# Patient Record
Sex: Female | Born: 1980 | Race: White | Hispanic: No | Marital: Married | Smoking: Never smoker
Health system: Southern US, Community
[De-identification: ages and names within clinical notes are randomized; demographics above are authoritative.]

## PROBLEM LIST (undated history)

## (undated) DIAGNOSIS — F329 Major depressive disorder, single episode, unspecified: Secondary | ICD-10-CM

## (undated) HISTORY — PX: TONSILLECTOMY: SUR1361

## (undated) HISTORY — PX: APPENDECTOMY: SHX54

## (undated) HISTORY — PX: BREAST SURGERY: SHX581

## (undated) HISTORY — PX: ABDOMINAL SURGERY: SHX537

## (undated) HISTORY — PX: TUBAL LIGATION: SHX77

## (undated) HISTORY — DX: Major depressive disorder, single episode, unspecified: F32.9

---

## 2017-09-09 ENCOUNTER — Other Ambulatory Visit: Payer: Self-pay

## 2017-09-09 ENCOUNTER — Emergency Department (INDEPENDENT_AMBULATORY_CARE_PROVIDER_SITE_OTHER)
Admission: EM | Admit: 2017-09-09 | Discharge: 2017-09-09 | Disposition: A | Discharge status: Home / Self Care | Payer: Managed Care, Other (non HMO) | Source: Home / Self Care | Attending: Family Medicine | Admitting: Family Medicine

## 2017-09-09 ENCOUNTER — Emergency Department (INDEPENDENT_AMBULATORY_CARE_PROVIDER_SITE_OTHER): Payer: Managed Care, Other (non HMO)

## 2017-09-09 DIAGNOSIS — M25512 Pain in left shoulder: Secondary | ICD-10-CM

## 2017-09-09 DIAGNOSIS — M79622 Pain in left upper arm: Secondary | ICD-10-CM | POA: Diagnosis not present

## 2017-09-09 MED ORDER — KETOROLAC TROMETHAMINE 60 MG/2ML IM SOLN
60.0000 mg | Freq: Once | INTRAMUSCULAR | Status: AC
  Administered 2017-09-09: 60 mg via INTRAMUSCULAR

## 2017-09-09 MED ORDER — TRAMADOL HCL 50 MG PO TABS: 50.0000 mg | ORAL_TABLET | Freq: Four times a day (QID) | ORAL | 0 refills | Status: DC | PRN

## 2017-09-09 NOTE — ED Provider Notes (Signed)
[facility]    CSN: [id number] Arrival date & time: 09/09/17  1658     History   Chief Complaint Chief Complaint  Patient presents with  . Shoulder Pain    HPI Diana Burton is a 37 y.o. female.   HPI  Diana Burton is a 37 y.o. female presenting to UC with c/o constant Left shoulder pain that started about 3 weeks ago. She believes possibly due to a workout routine but denies specific known injury.  Pain is worse when movement, severe at times, especially trying to raise her arm above her head. Pain is aching and throbbing, radiating down her upper arm.  She tried OTC Tylenol and Motrin w/o relief.     History reviewed. No pertinent past medical history.  There are no active problems to display for this patient.   Past Surgical History:  Procedure Laterality Date  . ABDOMINAL SURGERY     lipo and tummy tuck  . APPENDECTOMY    . BREAST SURGERY     implants and lumpectomy  . CESAREAN SECTION    . TONSILLECTOMY    . TUBAL LIGATION      OB History   None      Home Medications    Prior to Admission medications   Medication Sig Start Date End Date Taking? Authorizing Provider  phentermine 15 MG capsule Take 15 mg by mouth every morning.   Yes [provider]  sertraline (ZOLOFT) 25 MG tablet Take 25 mg by mouth daily.   Yes [provider]  traMADol (ULTRAM) 50 MG tablet Take 1 tablet (50 mg total) by mouth every 6 (six) hours as needed. 09/09/17   Lurene Shadow, PA-C    Family History Family History  Problem Relation Age of Onset  . Stroke Mother   . Cancer Mother   . Cancer Father     Social History Social History   Tobacco Use  . Smoking status: Unknown If Ever Smoked  Substance Use Topics  . Alcohol use: Yes  . Drug use: Not Currently     Allergies   Patient has no known allergies.   Review of Systems Review of Systems  Musculoskeletal: Positive for arthralgias. Negative for joint swelling, neck pain and  neck stiffness.  Skin: Negative for color change, rash and wound.  Neurological: Negative for numbness.     Physical Exam Triage Vital Signs ED Triage Vitals [09/09/17 1714]  Enc Vitals Group     BP (!) 143/77     Pulse Rate 68     Resp      Temp 97.9 F (36.6 C)     Temp Source Oral     SpO2 100 %     Weight 182 lb (82.6 kg)     Height 5\' 11"  (1.803 m)     Head Circumference      Peak Flow      Pain Score 8     Pain Loc      Pain Edu?      Excl. in GC?    No data found.  Updated Vital Signs BP (!) 143/77 (BP Location: Right Arm)   Pulse 68   Temp 97.9 F (36.6 C) (Oral)   Ht 5\' 11"  (1.803 m)   Wt 182 lb (82.6 kg)   LMP 08/26/2017   SpO2 100%   BMI 25.38 kg/m   Visual Acuity Right Eye Distance:   Left Eye Distance:   Bilateral Distance:  Right Eye Near:   Left Eye Near:    Bilateral Near:     Physical Exam  Constitutional: She is oriented to person, place, and time. She appears well-developed and well-nourished.  HENT:  Head: Normocephalic and atraumatic.  Eyes: EOM are normal.  Neck: Normal range of motion.  Cardiovascular: Normal rate.  Pulses:      Radial pulses are 2+ on the left side.  Pulmonary/Chest: Effort normal.  Musculoskeletal: She exhibits tenderness. She exhibits no edema.  Left shoulder: tenderness to anterior aspect and proximal deltoid. Decreased abduction due to pain.  Full ROM elbow, non-tender.  5/5 grip strength.  Neurological: She is alert and oriented to person, place, and time.  Skin: Skin is warm and dry. Capillary refill takes less than 2 seconds.  Psychiatric: She has a normal mood and affect. Her behavior is normal.  Nursing note and vitals reviewed.    UC Treatments / Results  Labs (all labs ordered are listed, but only abnormal results are displayed) Labs Reviewed - No data to display  EKG None  Radiology Dg Shoulder Left  Result Date: 09/09/2017 CLINICAL DATA:  Left shoulder pain EXAM: LEFT SHOULDER - 2+  VIEW COMPARISON:  None. FINDINGS: There is no evidence of fracture or dislocation. There is no evidence of arthropathy or other focal bone abnormality. Soft tissues are unremarkable. IMPRESSION: Negative. Electronically Signed   By: Charlett NoseKevin  Dover M.D.   On: 09/09/2017 18:48    Procedures Procedures (including critical care time)  Medications Ordered in UC Medications  ketorolac (TORADOL) injection 60 mg (60 mg Intramuscular Given 09/09/17 1853)    Initial Impression / Assessment and Plan / UC Course  I have reviewed the triage vital signs and the nursing notes.  Pertinent labs & imaging results that were available during my care of the patient were reviewed by me and considered in my medical decision making (see chart for details).     Pain likely due to biceps tendonitis Will try conservative tx F/u with Sports Medicine  Final Clinical Impressions(s) / UC Diagnoses   Final diagnoses:  Left shoulder pain  Left upper arm pain     Discharge Instructions      Tramadol is strong pain medication. While taking, do not drink alcohol, drive, or perform any other activities that requires focus while taking these medications.   Please call to schedule a follow up appointment with Sports Medicine, Dr. Denyse Amassorey, this week for further evaluation and treatment of shoulder pain.    ED Prescriptions    Medication Sig Dispense Auth. Provider   traMADol (ULTRAM) 50 MG tablet Take 1 tablet (50 mg total) by mouth every 6 (six) hours as needed. 15 tablet Lurene ShadowPhelps, Khaleelah Yowell O, PA-C     Controlled Substance Prescriptions [place] Controlled Substance Registry consulted? Yes, I have consulted the  Controlled Substances Registry for this patient, and feel the risk/benefit ratio today is favorable for proceeding with this prescription for a controlled substance.   Lurene Shadowhelps, Orlena Garmon O, [place]-C 09/10/17 [phone]

## 2017-09-09 NOTE — Discharge Instructions (Signed)
°  Tramadol is strong pain medication. While taking, do not drink alcohol, drive, or perform any other activities that requires focus while taking these medications.   Please call to schedule a follow up appointment with Sports Medicine, Dr. Denyse Amassorey, this week for further evaluation and treatment of shoulder pain.

## 2017-09-09 NOTE — ED Triage Notes (Signed)
Pt states that she has been having shoulder pain for about 3 weeks now.  Denies injury.  Hurts all the time now from shoulder to arm.

## 2017-09-12 ENCOUNTER — Encounter: Payer: Self-pay | Admitting: Family Medicine

## 2017-09-12 ENCOUNTER — Ambulatory Visit (INDEPENDENT_AMBULATORY_CARE_PROVIDER_SITE_OTHER): Payer: Managed Care, Other (non HMO) | Admitting: Family Medicine

## 2017-09-12 VITALS — BP 116/56 | HR 72 | Ht 71.0 in | Wt 181.0 lb

## 2017-09-12 DIAGNOSIS — F32A Depression, unspecified: Secondary | ICD-10-CM

## 2017-09-12 DIAGNOSIS — M25512 Pain in left shoulder: Secondary | ICD-10-CM

## 2017-09-12 DIAGNOSIS — F329 Major depressive disorder, single episode, unspecified: Secondary | ICD-10-CM | POA: Insufficient documentation

## 2017-09-12 HISTORY — DX: Depression, unspecified: F32.A

## 2017-09-12 NOTE — Patient Instructions (Addendum)
Thank you for coming in today. Attend PT Modify lifting. Keep you hand in your peripheral vision with lifting.  If pain is >3/10 (or more than mild) reduce the activity.   Call or go to the ER if you develop a large red swollen joint with extreme pain or oozing puss.   Recheck with me in 4 weeks.  Return sooner if needed.   Up to 2 aleve twice daily for pain as needed.     Shoulder Impingement Syndrome Rehab Ask your health care provider which exercises are safe for you. Do exercises exactly as told by your health care provider and adjust them as directed. It is normal to feel mild stretching, pulling, tightness, or discomfort as you do these exercises, but you should stop right away if you feel sudden pain or your pain gets worse.Do not begin these exercises until told by your health care provider. Stretching and range of motion exercise This exercise warms up your muscles and joints and improves the movement and flexibility of your shoulder. This exercise also helps to relieve pain and stiffness. Exercise A: Passive horizontal adduction  1. Sit or stand and pull your left / right elbow across your chest, toward your other shoulder. Stop when you feel a gentle stretch in the back of your shoulder and upper arm. ? Keep your arm at shoulder height. ? Keep your arm as close to your body as you comfortably can. 2. Hold for __________ seconds. 3. Slowly return to the starting position. Repeat __________ times. Complete this exercise __________ times a day. Strengthening exercises These exercises build strength and endurance in your shoulder. Endurance is the ability to use your muscles for a long time, even after they get tired. Exercise B: External rotation, isometric 1. Stand or sit in a doorway, facing the door frame. 2. Bend your left / right elbow and place the back of your wrist against the door frame. Only your wrist should be touching the frame. Keep your upper arm at your  side. 3. Gently press your wrist against the door frame, as if you are trying to push your arm away from your abdomen. ? Avoid shrugging your shoulder while you press your hand against the door frame. Keep your shoulder blade tucked down toward the middle of your back. 4. Hold for __________ seconds. 5. Slowly release the tension, and relax your muscles completely before you do the exercise again. Repeat __________ times. Complete this exercise __________ times a day. Exercise C: Internal rotation, isometric  1. Stand or sit in a doorway, facing the door frame. 2. Bend your left / right elbow and place the inside of your wrist against the door frame. Only your wrist should be touching the frame. Keep your upper arm at your side. 3. Gently press your wrist against the door frame, as if you are trying to push your arm toward your abdomen. ? Avoid shrugging your shoulder while you press your hand against the door frame. Keep your shoulder blade tucked down toward the middle of your back. 4. Hold for __________ seconds. 5. Slowly release the tension, and relax your muscles completely before you do the exercise again. Repeat __________ times. Complete this exercise __________ times a day. Exercise D: Scapular protraction, supine  1. Lie on your back on a firm surface. Hold a __________ weight in your left / right hand. 2. Raise your left / right arm straight into the air so your hand is directly above your shoulder joint. 3.  Push the weight into the air so your shoulder lifts off of the surface that you are lying on. Do not move your head, neck, or back. 4. Hold for __________ seconds. 5. Slowly return to the starting position. Let your muscles relax completely before you repeat this exercise. Repeat __________ times. Complete this exercise __________ times a day. Exercise E: Scapular retraction  1. Sit in a stable chair without armrests, or stand. 2. Secure an exercise band to a stable object  in front of you so the band is at shoulder height. 3. Hold one end of the exercise band in each hand. Your palms should face down. 4. Squeeze your shoulder blades together and move your elbows slightly behind you. Do not shrug your shoulders while you do this. 5. Hold for __________ seconds. 6. Slowly return to the starting position. Repeat __________ times. Complete this exercise __________ times a day. Exercise F: Shoulder extension  1. Sit in a stable chair without armrests, or stand. 2. Secure an exercise band to a stable object in front of you where the band is above shoulder height. 3. Hold one end of the exercise band in each hand. 4. Straighten your elbows and lift your hands up to shoulder height. 5. Squeeze your shoulder blades together and pull your hands down to the sides of your thighs. Stop when your hands are straight down by your sides. Do not let your hands go behind your body. 6. Hold for __________ seconds. 7. Slowly return to the starting position. Repeat __________ times. Complete this exercise __________ times a day. This information is not intended to replace advice given to you by your health care provider. Make sure you discuss any questions you have with your health care provider. Document Released: 02/05/2005 Document Revised: 10/13/2015 Document Reviewed: 01/08/2015 Elsevier Interactive Patient Education  [employer].

## 2017-09-13 ENCOUNTER — Encounter: Payer: Self-pay | Admitting: Family Medicine

## 2017-09-13 NOTE — Progress Notes (Signed)
Subjective:    I'm seeing this patient as a consultation for:  Diana RocherErin Phelps PA-C  CC: Shoulder pain  HPI: Diana BumpsJessica notes a one-month history of left shoulder pain.  She denies any injury but notes that she is been weight lifting and doing kick boxing class.  She notes pain in the left upper arm worse with overhead motion reaching back.  She denies any radiating pain weakness or numbness.  She notes her symptoms worsened after she pain in her house over the weekend.  She is been trying Tylenol and ibuprofen which helped a little.  She was seen in urgent care where she was thought to have a rotator cuff strain and was prescribed tramadol which helped a bit.  X-ray of the left shoulder was unremarkable.  She denies any fevers or chills  Past medical history, Surgical history, Family history not pertinant except as noted below, Social history, Allergies, and medications have been entered into the medical record, reviewed, and no changes needed.   Review of Systems: No headache, visual changes, nausea, vomiting, diarrhea, constipation, dizziness, abdominal pain, skin rash, fevers, chills, night sweats, weight loss, swollen lymph nodes, body aches, joint swelling, muscle aches, chest pain, shortness of breath, mood changes, visual or auditory hallucinations.   Objective:    Vitals:   09/12/17 1326  BP: (!) 116/56  Pulse: 72   General: Well Developed, well nourished, and in no acute distress.  Neuro/Psych: Alert and oriented x3, extra-ocular muscles intact, able to move all 4 extremities, sensation grossly intact. Skin: Warm and dry, no rashes noted.  Respiratory: Not using accessory muscles, speaking in full sentences, trachea midline.  Cardiovascular: Pulses palpable, no extremity edema. Abdomen: Does not appear distended. MSK:  Left shoulder: Normal-appearing with no deformity or erythema. Range of motion full passive motion but pain with active abduction beyond 100 degrees. Strength is  intact however pain with resisted abduction. Positive Hawkins and Neer's test. Positive empty can test. Negative Yergason's and speeds test.   Right shoulder normal-appearing nontender normal motion normal strength negative impingement testing.  C-spine: Normal-appearing nontender normal motion.  Pulses capillary fill and sensation are intact bilateral upper extremities.  Procedure: Real-time Ultrasound Guided Injection of left subacromial bursa  Device: GE Logiq E   Images permanently stored and available for review in the ultrasound unit. Verbal informed consent obtained.  Discussed risks and benefits of procedure. Warned about infection bleeding damage to structures skin hypopigmentation and fat atrophy among others. Patient expresses understanding and agreement Time-out conducted.   Noted no overlying erythema, induration, or other signs of local infection.   Skin prepped in a sterile fashion.   Local anesthesia: Topical Ethyl chloride.   With sterile technique and under real time ultrasound guidance:  40mg  kenalog and 2ml marcaine injected easily.   Completed without difficulty   Pain immediately resolved suggesting accurate placement of the medication.   Advised to call if fevers/chills, erythema, induration, drainage, or persistent bleeding.   Images permanently stored and available for review in the ultrasound unit.  Impression: Technically successful ultrasound guided injection.      Lab and Radiology Results No results found for this or any previous visit (from the past 72 hour(s)). Dg Shoulder Left  Result Date: 09/09/2017 CLINICAL DATA:  Left shoulder pain EXAM: LEFT SHOULDER - 2+ VIEW COMPARISON:  None. FINDINGS: There is no evidence of fracture or dislocation. There is no evidence of arthropathy or other focal bone abnormality. Soft tissues are unremarkable. IMPRESSION: Negative. Electronically  Signed   By: Charlett Nose M.D.   On: 09/09/2017 18:48  I personally  (independently) visualized and performed the interpretation of the images attached in this note.   Impression and Recommendations:    Assessment and Plan: 37 y.o. female with left shoulder pain due to rotator cuff tendinitis.  Plan for physical therapy home exercise program injection as above and prescription strength NSAIDs as needed.  Recheck in about 6 weeks.  Return sooner if needed..   Orders Placed This Encounter  Procedures  . Ambulatory referral to Physical Therapy    Referral Priority:   Routine    Referral Type:   Physical Medicine    Referral Reason:   Specialty Services Required    Requested Specialty:   Physical Therapy   No orders of the defined types were placed in this encounter.   Discussed warning signs or symptoms. Please see discharge instructions. Patient expresses understanding.

## 2017-09-16 ENCOUNTER — Encounter: Payer: Self-pay | Admitting: Physical Therapy

## 2017-09-16 ENCOUNTER — Ambulatory Visit: Payer: Managed Care, Other (non HMO) | Admitting: Physical Therapy

## 2017-09-16 DIAGNOSIS — M25512 Pain in left shoulder: Secondary | ICD-10-CM | POA: Diagnosis not present

## 2017-09-16 NOTE — Patient Instructions (Addendum)
Access Code: [email or web address]  URL: [email or web address]  Date: 09/16/2017  Prepared by: Ivery QualeBrian Nelson   Exercises  Standing Shoulder Posterior Capsule Stretch - 10 reps - 3 sets - 2x daily - 6x weekly  Doorway Pec Stretch at 90 Degrees Abduction - 10 reps - 3 sets - 2x daily - 6x weekly  Standing Shoulder Row with Anchored Resistance - 10 reps - 3 sets - 2x daily - 6x weekly  Shoulder Extension with Resistance - Neutral - 10 reps - 3 sets - 2x daily - 6x weekly  Shoulder External Rotation with Anchored Resistance - 10 reps - 3 sets - 2x daily - 6x weekly  Shoulder Internal Rotation with Resistance - 10 reps - 3 sets - 2x daily - 6x weekly  Supine Shoulder Flexion Extension AAROM with Dowel - 10 reps - 3 sets - 2x daily - 6x weekly  Supine Shoulder Abduction AAROM with Dowel - 10 reps - 3 sets - 2x daily - 6x weekly  Pt provided illustrated copy and resistance band  TENS stands for Transcutaneous Electrical Nerve Stimulation. In other words, electrical impulses are allowed to pass through the skin in order to excite a nerve.   Purpose and Use of TENS:  TENS is a method used to manage acute and chronic pain without the use of drugs. It has been effective in managing pain associated with surgery, sprains, strains, trauma, rheumatoid arthritis, and neuralgias. It is a non-addictive, low risk, and non-invasive technique used to control pain. It is not, by any means, a curative form of treatment.   How TENS Works:  Most TENS units are a [employer] pocket-sized unit powered by one 9 volt battery. Attached to the outside of the unit are two lead wires where two pins and/or snaps connect on each wire. All units come with a set of four reusable pads or electrodes. These are placed on the skin surrounding the area involved. By inserting the leads into  the pads, the electricity can pass from the unit making the circuit complete.  As the intensity is turned up slowly, the electrical current enters the  body from the electrodes through the skin to the surrounding nerve fibers. This triggers the release of hormones from within the body. These hormones contain pain relievers. By increasing the circulation of these hormones, the person's pain may be lessened. It is also believed that the electrical stimulation itself helps to block the pain messages being sent to the brain, thus also decreasing the body's perception of pain.   Hazards:  TENS units are NOT to be used by patients with PACEMAKERS, DEFIBRILLATORS, DIABETIC PUMPS, PREGNANT WOMEN, and patients with SEIZURE DISORDERS.  TENS units are NOT to be used over the heart, throat, brain, or spinal cord.  One of the major side effects from the TENS unit may be skin irritation. Some people may develop a rash if they are sensitive to the materials used in the electrodes or the connecting wires.   Wear the unit for 15-5230min .   Avoid overuse due the body getting used to the stem making it not as effective over time.  TENS UNIT: This is helpful for muscle pain and spasm.   Search and Purchase a TENS 7000 2nd edition at www.tenspros.com. OR [employer] It should be less than $30.     TENS unit instructions: Do not shower or bathe with the unit on Turn the unit off before removing electrodes or batteries If the electrodes lose stickiness add a drop of  water to the electrodes after they are disconnected from the unit and place on plastic sheet. If you continued to have difficulty, call the TENS unit company to purchase more electrodes. Do not apply lotion on the skin area prior to use. Make sure the skin is clean and dry as this will help prolong the life of the electrodes. After use, always check skin for unusual red areas, rash or other skin difficulties. If there are any skin problems, does not apply electrodes to the same area. Never remove the electrodes from the unit by pulling the wires. Do not use the TENS unit or electrodes other than as  directed. Do not change electrode placement without consultating your therapist or physician. Keep 2 fingers with between each electrode. Wear time ratio is 2:1, on to off times.    For example on for 30 minutes off for 15 minutes and then on for 30 minutes off for 15 minutes

## 2017-09-16 NOTE — Therapy (Signed)
[facility] [place] [address] [place], [place], [address] Phone: [phone]   Fax:  [phone]  Physical Therapy Evaluation  Patient Details  Name: Diana Burton MRN: [id number] Date of Birth: 04/22/1980 Referring Provider: Stann Mainland, MD   Encounter Date: 09/16/2017  PT End of Session - 09/16/17 0941    Visit Number  1    Number of Visits  12    Date for PT Re-Evaluation  10/28/17    PT Start Time  0850    PT Stop Time  0930    PT Time Calculation (min)  40 min    Activity Tolerance  Patient tolerated treatment well       Past Medical History:  Diagnosis Date  . Depression 09/12/2017    Past Surgical History:  Procedure Laterality Date  . ABDOMINAL SURGERY     lipo and tummy tuck  . APPENDECTOMY    . BREAST SURGERY     implants and lumpectomy  . CESAREAN SECTION    . TONSILLECTOMY    . TUBAL LIGATION      There were no vitals filed for this visit.   Subjective Assessment - 09/16/17 0931    Subjective  Pt relays Lt shoulder pain for last 2 weeks that started after she began kickboxing and weight lifting program. She recieved injection which has helped a lot but she still has pain and difficulty reaching up or out.    Pertinent History  depression    Limitations  Reading    Diagnostic tests  x-rays neg    Currently in Pain?  Yes    Pain Score  3     Pain Location  Shoulder    Pain Orientation  Left    Pain Descriptors / Indicators  Stabbing;Tightness    Pain Type  Acute pain    Pain Radiating Towards  none    Pain Onset  1 to 4 weeks ago    Pain Frequency  Intermittent    Aggravating Factors   reaching    Pain Relieving Factors  injection, NSAIDS    Multiple Pain Sites  No         [facility] PT Assessment - 09/16/17 0001      Assessment   Medical Diagnosis  acute Lt shoulder pain/tendonitis    Referring Provider  Stann Mainland, MD    Prior Therapy  none      Precautions   Precautions  None      Balance Screen   Has the patient fallen in the past 6 months  No    Has the patient had a decrease in activity level because of a fear of falling?   No    Is the patient reluctant to leave their home because of a fear of falling?   No      Prior Function   Level of Independence  Independent    [employer], computer based work    Leisure  shopping      Cognition   Overall Cognitive Status  Within Functional Limits for tasks assessed      Observation/Other Assessments   Focus on Therapeutic Outcomes (FOTO)   40% limited      Sensation   Light Touch  Appears Intact      ROM / Strength   AROM / PROM / Strength  AROM;PROM;Strength      AROM   AROM Assessment Site  Shoulder    Right/Left Shoulder  Left  Left Shoulder Flexion  120 Degrees    Left Shoulder ABduction  105 Degrees    Left Shoulder Internal Rotation  -- T8    Left Shoulder External Rotation  -- WFL      PROM   Overall PROM   Within functional limits for tasks performed      Strength   Overall Strength  -- 4+/5 limited by pain      Palpation   Palpation comment  ttp in posterior/superior/lateral shoulder      Special Tests    Special Tests  -- postive impingment tests                Objective measurements completed on examination: See above findings.      [facility] Adult PT Treatment/Exercise - 09/16/17 0001      Modalities   Modalities  Electrical Stimulation;Moist Heat      Moist Heat Therapy   Number Minutes Moist Heat  15 Minutes with TENS    Moist Heat Location  Shoulder      Electrical Stimulation   Electrical Stimulation Location  Lt shlder    Electrical Stimulation Action  TENS    Electrical Stimulation Parameters  tolerance    Electrical Stimulation Goals  Pain             PT Education - 09/16/17 0941    Education Details  HEP, TENS, poc, ice/heat    Person(s) Educated  Patient    Methods  Demonstration;Explanation;Verbal cues;Handout    Comprehension   Verbalized understanding;Need further instruction          PT Long Term Goals - 09/16/17 0949      PT LONG TERM GOAL #1   Title  Pt will be I and compliant with HEP. 6 weeks 10/28/17    Status  New      PT LONG TERM GOAL #2   Title  Pt will improve FOTO score to less than 26% limited. 6 weeks 10/28/17    Status  New      PT LONG TERM GOAL #3   Title  Pt will improve AROM to WNL to allow for reaching. 6 weeks 10/28/17    Status  New      PT LONG TERM GOAL #4   Title  Pt will improve Lt shoulder strength to 5/5 MMT. 6 weeks 10/28/17      PT LONG TERM GOAL #5   Title  Pt will report less than 2/10 overall pain with all ADL's including exercise and lifting. 6 weeks 10/28/17             Plan - 09/16/17 0944    Clinical Impression Statement  Pt presents with acute Lt shoulder pain consistent with RTC tendonitis or impingment. She is doing some better after injection with overall pain but She has increased pain and tenderness in her shoulder that is increased with reaching up and out and limiting her full functional abilities. She will benefit from skilled PT to adress her deficits    Clinical Presentation  Stable    Clinical Decision Making  Low    Clinical Impairments Affecting Rehab Potential  depression with flat affect    PT Frequency  2x / week    PT Duration  6 weeks    PT Treatment/Interventions  ADLs/Self Care Home Management;Iontophoresis 4mg /ml Dexamethasone;Cryotherapy;Electrical Stimulation;Moist Heat;Ultrasound;Therapeutic exercise;Neuromuscular re-education;Patient/family education;Manual techniques;Passive range of motion;Dry needling    PT Next Visit Plan  review HEP, modalites and MT as needed  Patient will benefit from skilled therapeutic intervention in order to improve the following deficits and impairments:  Decreased activity tolerance, Decreased range of motion, Decreased strength, Increased muscle spasms, Pain  Visit Diagnosis: Acute pain of left  shoulder     Problem List Patient Active Problem List   Diagnosis Date Noted  . Depression 09/12/2017    April Manson, PT, DPT 09/16/2017, 9:54 AM  [facility] [address] [place], [place], [address] Phone: [phone]   Fax:  [phone]  Name: Diana Burton MRN: [id number] Date of Birth: 1980-07-02

## 2017-09-16 NOTE — Addendum Note (Signed)
Addended by: April MansonNELSON, Taraji Mungo R on: 09/16/2017 09:58 AM   Modules accepted: Orders

## 2017-09-20 ENCOUNTER — Ambulatory Visit (INDEPENDENT_AMBULATORY_CARE_PROVIDER_SITE_OTHER): Payer: Managed Care, Other (non HMO) | Admitting: Physical Therapy

## 2017-09-20 DIAGNOSIS — M25512 Pain in left shoulder: Secondary | ICD-10-CM

## 2017-09-20 NOTE — Therapy (Addendum)
[facility] [address] [place], [place], [address] Phone: [phone]   Fax:  [phone]  Physical Therapy Treatment/Discharge  Patient Details  Name: Diana Burton MRN: [id number] Date of Birth: 19-Oct-1980 Referring Provider: Lance Muss, MD   Encounter Date: 09/20/2017  PT End of Session - 09/20/17 0927    Visit Number  2    Number of Visits  12    Date for PT Re-Evaluation  10/28/17    PT Start Time  0852    PT Stop Time  0937    PT Time Calculation (min)  45 min    Activity Tolerance  Patient tolerated treatment well       Past Medical History:  Diagnosis Date  . Depression 09/12/2017    Past Surgical History:  Procedure Laterality Date  . ABDOMINAL SURGERY     lipo and tummy tuck  . APPENDECTOMY    . BREAST SURGERY     implants and lumpectomy  . CESAREAN SECTION    . TONSILLECTOMY    . TUBAL LIGATION      There were no vitals filed for this visit.  Subjective Assessment - 09/20/17 0922    Subjective  Pt relays her shoulder is doing much better.    Currently in Pain?  Yes    Pain Score  1     Pain Location  Shoulder    Pain Orientation  Left    Pain Descriptors / Indicators  Other (Comment) pulling         [facility] PT Assessment - 09/20/17 0001      Assessment   Medical Diagnosis  acute Lt shoulder pain/tendonitis    Referring Provider  Lance Muss, MD      AROM   AROM Assessment Site  Shoulder    Right/Left Shoulder  Left    Left Shoulder Flexion  140 Degrees    Left Shoulder ABduction  120 Degrees    Left Shoulder Internal Rotation  -- T6                   [facility] Adult PT Treatment/Exercise - 09/20/17 0001      Exercises   Exercises  Shoulder      Shoulder Exercises: Standing   External Rotation  Left;20 reps    Theraband Level (Shoulder External Rotation)  Level 1 (Yellow)    Internal Rotation  20 reps;Left    Theraband Level (Shoulder Internal Rotation)  Level 1  (Yellow)    Flexion  AAROM;15 reps cane    ABduction  AAROM;15 reps cane    Extension  20 reps    Theraband Level (Shoulder Extension)  Level 2 (Red)    Row  20 reps    Theraband Level (Shoulder Row)  Level 2 (Red)      Shoulder Exercises: Pulleys   Flexion  2 minutes    ABduction  2 minutes      Shoulder Exercises: Stretch   Cross Chest Stretch  30 seconds;2 reps    Other Shoulder Stretches  chest stretch at doorway 30 sec low and mid      Modalities   Modalities  Electrical Stimulation;Moist Heat      Moist Heat Therapy   Number Minutes Moist Heat  15 Minutes    Moist Heat Location  Shoulder      Electrical Stimulation   Electrical Stimulation Location  Lt shlder    Electrical Stimulation Action  TENS    Electrical Stimulation  Parameters  tolerance    Electrical Stimulation Goals  Pain             PT Education - 09/20/17 0927    Education Details  technique for new exercises    Person(s) Educated  Patient    Methods  Explanation;Demonstration;Tactile cues;Verbal cues    Comprehension  Verbalized understanding;Need further instruction          PT Long Term Goals - 09/16/17 0949      PT LONG TERM GOAL #1   Title  Pt will be I and compliant with HEP. 6 weeks 10/28/17    Status  New      PT LONG TERM GOAL #2   Title  Pt will improve FOTO score to less than 26% limited. 6 weeks 10/28/17    Status  New      PT LONG TERM GOAL #3   Title  Pt will improve AROM to WNL to allow for reaching. 6 weeks 10/28/17    Status  New      PT LONG TERM GOAL #4   Title  Pt will improve Lt shoulder strength to 5/5 MMT. 6 weeks 10/28/17      PT LONG TERM GOAL #5   Title  Pt will report less than 2/10 overall pain with all ADL's including exercise and lifting. 6 weeks 10/28/17            Plan - 09/20/17 6438    Clinical Impression Statement  HEP reviewed with patient with cuing and demostration for technique. She has made great progress thus far with pain and ROM, see updated  measurements. Pt will continue to progress as able.    PT Frequency  2x / week    PT Duration  6 weeks    PT Treatment/Interventions  ADLs/Self Care Home Management;Iontophoresis 23m/ml Dexamethasone;Cryotherapy;Electrical Stimulation;Moist Heat;Ultrasound;Therapeutic exercise;Neuromuscular re-education;Patient/family education;Manual techniques;Passive range of motion;Dry needling    PT Next Visit Plan  progress strengthening as able, modalities PRN       Patient will benefit from skilled therapeutic intervention in order to improve the following deficits and impairments:  Decreased activity tolerance, Decreased range of motion, Decreased strength, Increased muscle spasms, Pain  Visit Diagnosis: Acute pain of left shoulder     Problem List Patient Active Problem List   Diagnosis Date Noted  . Depression 09/12/2017    BDebbe Odea PT, DPT 09/20/2017, 9:30 AM  [facility] [place] [address]Phone: [phone]  Fax:  [phone] Name: Diana TamayoMRN: [id number] of Birth: 804-06-1980  PHYSICAL THERAPY DISCHARGE SUMMARY  Visits from Start of Care: 2  Current functional level related to goals / functional outcomes: See above   Remaining deficits: See above  Plan: Patient agrees to discharge.  Patient goals were not met. Patient is being discharged due to not returning since the last visit.  ?????    BElsie Ra PT, DPT 10/17/17 10:12 AM

## 2017-09-24 ENCOUNTER — Encounter: Payer: Managed Care, Other (non HMO) | Admitting: Physical Therapy

## 2017-09-27 ENCOUNTER — Encounter: Payer: Managed Care, Other (non HMO) | Admitting: Physical Therapy

## 2017-10-10 ENCOUNTER — Ambulatory Visit: Payer: Managed Care, Other (non HMO) | Admitting: Family Medicine

## 2017-11-29 ENCOUNTER — Emergency Department (INDEPENDENT_AMBULATORY_CARE_PROVIDER_SITE_OTHER)
Admission: EM | Admit: 2017-11-29 | Discharge: 2017-11-29 | Disposition: A | Discharge status: Home / Self Care | Payer: Managed Care, Other (non HMO) | Source: Home / Self Care | Attending: Family Medicine | Admitting: Family Medicine

## 2017-11-29 ENCOUNTER — Other Ambulatory Visit: Payer: Self-pay

## 2017-11-29 DIAGNOSIS — J029 Acute pharyngitis, unspecified: Secondary | ICD-10-CM | POA: Diagnosis not present

## 2017-11-29 LAB — POCT RAPID STREP A (OFFICE): Rapid Strep A Screen: NEGATIVE

## 2017-11-29 MED ORDER — PENICILLIN V POTASSIUM 500 MG PO TABS: ORAL_TABLET | ORAL | 0 refills | Status: DC

## 2017-11-29 NOTE — Discharge Instructions (Addendum)
Try warm salt water gargles for sore throat.  Try Ibuprofen 200mg, 4 tabs every 8 hours with food.  °

## 2017-11-29 NOTE — ED Triage Notes (Signed)
Sore throat, headache x 3 days 

## 2017-11-29 NOTE — ED Provider Notes (Signed)
[facility]    CSN: [id number] Arrival date & time: 11/29/17  1411     History   Chief Complaint Chief Complaint  Patient presents with  . Sore Throat    HPI Diana Burton is a 37 y.o. female.   Patient complains of sore throat for two days without cough or fevers, chills, and sweats.  She has had mild sinus pressure but no nasal congestion, and pressure in her ears.  The history is provided by the patient.    Past Medical History:  Diagnosis Date  . Depression 09/12/2017    Patient Active Problem List   Diagnosis Date Noted  . Depression 09/12/2017    Past Surgical History:  Procedure Laterality Date  . ABDOMINAL SURGERY     lipo and tummy tuck  . APPENDECTOMY    . BREAST SURGERY     implants and lumpectomy  . CESAREAN SECTION    . TONSILLECTOMY    . TUBAL LIGATION      OB History   None      Home Medications    Prior to Admission medications   Medication Sig Start Date End Date Taking? Authorizing Provider  penicillin v potassium (VEETID) 500 MG tablet Take one tab by mouth twice daily for 10 days 11/29/17   Lattie Haw, MD  sertraline (ZOLOFT) 25 MG tablet Take 25 mg by mouth daily.    [provider]    Family History Family History  Problem Relation Age of Onset  . Stroke Mother   . Cancer Mother   . Cancer Father     Social History Social History   Tobacco Use  . Smoking status: Unknown If Ever Smoked  . Smokeless tobacco: Never Used  Substance Use Topics  . Alcohol use: Yes  . Drug use: Not Currently     Allergies   Patient has no known allergies.   Review of Systems Review of Systems + sore throat No cough No pleuritic pain No wheezing No nasal congestion No post-nasal drainage + sinus pain/pressure No itchy/red eyes ? earache No hemoptysis No SOB No fever/chills No nausea No vomiting No abdominal pain No diarrhea No urinary symptoms No skin rash + fatigue No myalgias +  headache    Physical Exam Triage Vital Signs ED Triage Vitals  Enc Vitals Group     BP 11/29/17 1458 115/80     Pulse Rate 11/29/17 1458 76     Resp --      Temp 11/29/17 1458 98.6 F (37 C)     Temp Source 11/29/17 1458 Oral     SpO2 11/29/17 1458 100 %     Weight 11/29/17 1459 175 lb (79.4 kg)     Height 11/29/17 1459 5\' 11"  (1.803 m)     Head Circumference --      Peak Flow --      Pain Score 11/29/17 1458 8     Pain Loc --      Pain Edu? --      Excl. in GC? --    No data found.  Updated Vital Signs BP 115/80 (BP Location: Right Arm)   Pulse 76   Temp 98.6 F (37 C) (Oral)   Ht 5\' 11"  (1.803 m)   Wt 79.4 kg   SpO2 100%   BMI 24.41 kg/m   Visual Acuity Right Eye Distance:   Left Eye Distance:   Bilateral Distance:    Right Eye Near:   Left Eye  Near:    Bilateral Near:     Physical Exam Nursing notes and Vital Signs reviewed. Appearance:  Patient appears stated age, and in no acute distress Eyes:  Pupils are equal, round, and reactive to light and accomodation.  Extraocular movement is intact.  Conjunctivae are not inflamed  Ears:  Canals normal.  Tympanic membranes normal.  Nose:  Mildly congested turbinates.  No sinus tenderness.    Pharynx:  Mildly erythematous Neck:  Supple.  Tender enlarged tonsillar nodes bilaterally.  Enlarged posterior/lateral nodes are palpated bilaterally, tender to palpation on the left.   Lungs:  Clear to auscultation.  Breath sounds are equal.  Moving air well. Heart:  Regular rate and rhythm without murmurs, rubs, or gallops.  Abdomen:  Nontender without masses or hepatosplenomegaly.  Bowel sounds are present.  No CVA or flank tenderness.  Extremities:  No edema.  Skin:  No rash present.    UC Treatments / Results  Labs (all labs ordered are listed, but only abnormal results are displayed) Labs Reviewed  STREP A DNA PROBE  POCT RAPID STREP A (OFFICE) negative    EKG None  Radiology No results  found.  Procedures Procedures (including critical care time)  Medications Ordered in UC Medications - No data to display  Initial Impression / Assessment and Plan / UC Course  I have reviewed the triage vital signs and the nursing notes.  Pertinent labs & imaging results that were available during my care of the patient were reviewed by me and considered in my medical decision making (see chart for details).    Begin empiric Pen VK.  Throat culture pending. Followup with Family Doctor if not improved in about 10 days.   Final Clinical Impressions(s) / UC Diagnoses   Final diagnoses:  Acute pharyngitis, unspecified etiology  Pharyngitis, unspecified etiology     Discharge Instructions     Try warm salt water gargles for sore throat.  Try Ibuprofen 200mg , 4 tabs every 8 hours with food.     ED Prescriptions    Medication Sig Dispense Auth. Provider   penicillin v potassium (VEETID) 500 MG tablet Take one tab by mouth twice daily for 10 days 20 tablet Lattie Haw, MD         Lattie Haw, MD 11/29/17 1650

## 2017-11-30 ENCOUNTER — Telehealth: Payer: Self-pay

## 2017-11-30 LAB — STREP A DNA PROBE: Group A Strep Probe: NOT DETECTED

## 2017-11-30 NOTE — Telephone Encounter (Signed)
Left vm with normal tcx results. Advised to call if any questions.

## 2018-02-26 ENCOUNTER — Ambulatory Visit (INDEPENDENT_AMBULATORY_CARE_PROVIDER_SITE_OTHER): Payer: Managed Care, Other (non HMO) | Admitting: Family Medicine

## 2018-02-26 ENCOUNTER — Encounter: Payer: Self-pay | Admitting: Family Medicine

## 2018-02-26 VITALS — BP 123/76 | HR 80 | Ht 71.0 in | Wt 188.0 lb

## 2018-02-26 DIAGNOSIS — M25512 Pain in left shoulder: Secondary | ICD-10-CM | POA: Diagnosis not present

## 2018-02-26 NOTE — Patient Instructions (Signed)
Thank you for coming in today. Attend PT and do home exercises.  Recheck with me as needed.  If not improving we can start thinking MRI and evaluation for surgery.  Call or go to the ER if you develop a large red swollen joint with extreme pain or oozing puss.     Secondary Shoulder Impingement Syndrome Rehab Ask your health care provider which exercises are safe for you. Do exercises exactly as told by your health care provider and adjust them as directed. It is normal to feel mild stretching, pulling, tightness, or discomfort as you do these exercises, but you should stop right away if you feel sudden pain or your pain gets worse. Do not begin these exercises until told by your health care provider. Stretching and range of motion exercise This exercise warms up your muscles and joints and improves the movement and flexibility of your neck and shoulder. This exercise also helps to relieve pain and stiffness. Exercise A: Cervical side bend  1. Using good posture, sit on a stable chair, or stand up. 2. Without moving your shoulders, slowly tilt your left / right ear toward your left / right shoulder until you feel a stretch in your neck muscles. You should be looking straight ahead. 3. Hold for __________ seconds. 4. Slowly return to the starting position. 5. Repeat on your left / right side. Repeat __________ times. Complete this exercise __________ times a day. Strengthening exercises These exercises build strength and endurance in your shoulder. Endurance is the ability to use your muscles for a long time, even after they get tired. Exercise B: Scapular protraction, supine  1. Lie on your back on a firm surface. Hold a __________ weight in your left / right hand. 2. Raise your left / right arm straight into the air so your hand is directly above your shoulder joint. 3. Push the weight into the air so your shoulder lifts off of the surface that you are lying on. Do not move your head,  neck, or back. 4. Hold for __________ seconds. 5. Slowly return to the starting position. Let your muscles relax completely before you repeat this exercise. Repeat __________ times. Complete this exercise __________ times a day. Exercise C: Scapular retraction  1. Sit in a stable chair without armrests, or stand. 2. Secure an exercise band to a stable object in front of you so the band is at shoulder height. 3. Hold one end of the exercise band in each hand. Your palms should face down. 4. Squeeze your shoulder blades together and move your elbows slightly behind you. Do not shrug your shoulders while you do this. 5. Hold for __________ seconds. 6. Slowly return to the starting position. Repeat __________ times. Complete this exercise __________ times a day. Exercise D: Shoulder extension with scapular retraction  1. Sit in a stable chair without armrests, or stand. 2. Secure an exercise band to a stable object in front of you where it is above shoulder height. 3. Hold one end of the exercise band in each hand. 4. Straighten your elbows and lift your hands up to shoulder height. 5. Squeeze your shoulder blades together and pull your hands down to the sides of your thighs. Stop when your hands are straight down by your sides. Do not let your hands go behind your body. 6. Hold for __________ seconds. 7. Slowly return to the starting position. Repeat __________ times. Complete this exercise __________ times a day. Exercise E: Shoulder abduction 1. Sit in  a stable chair without armrests, or stand. 2. If directed, hold a __________ weight in your left / right hand. 3. Start with your arms straight down. Turn your left / right hand so your palm faces in, toward your body. 4. Slowly lift your left / right hand out to your side. Do not lift your hand above shoulder height. ? Keep your arms straight. ? Avoid shrugging your shoulder while you do this movement. Keep your shoulder blade tucked down  toward the middle of your back. 5. Hold for __________ seconds. 6. Slowly lower your arm, and return to the starting position. Repeat __________ times. Complete this exercise __________ times a day. This information is not intended to replace advice given to you by your health care provider. Make sure you discuss any questions you have with your health care provider. Document Released: 02/05/2005 Document Revised: 10/13/2015 Document Reviewed: 01/08/2015 Elsevier Interactive Patient Education  2019 [employer].

## 2018-02-26 NOTE — Progress Notes (Signed)
Diana Burton is a 38 y.o. female who presents to [facility] today for left shoulder pain.  Diana Burton was seen in July for left shoulder pain and was thought to have rotator cuff tendinitis/bursitis or impingement.  She received a subacromial bursa injection and had 2 visits with physical therapy which worked quite well.  She notes over the last month or so she is had return of pain.  She notes pain mostly in the left lateral upper arm.  Pain is worse with overhead motion reaching back and with push-ups.  She continues to exercise with some resistance training as well as kickboxing.  She notes her pain is now becoming moderate to more severe and interfere with exercise as well as home activities such as cooking cleaning lifting and pushing.  She denies any radiating pain down her left arm.  She denies any recent injury or fall.  She is tried some return of home exercise program and ibuprofen which is helped only a little for her shoulder pain.    ROS:  As above  Exam:  BP 123/76   Pulse 80   Ht 5\' 11"  (1.803 m)   Wt 188 lb (85.3 kg)   BMI 26.22 kg/m  Wt Readings from Last 5 Encounters:  02/26/18 188 lb (85.3 kg)  11/29/17 175 lb (79.4 kg)  09/12/17 181 lb (82.1 kg)  09/09/17 182 lb (82.6 kg)   General: Well Developed, well nourished, and in no acute distress.  Neuro/Psych: Alert and oriented x3, extra-ocular muscles intact, able to move all 4 extremities, sensation grossly intact. Skin: Warm and dry, no rashes noted.  Respiratory: Not using accessory muscles, speaking in full sentences, trachea midline.  Cardiovascular: Pulses palpable, no extremity edema. Abdomen: Does not appear distended. MSK: C-spine: Nontender to spinal midline.  Normal neck motion. Left shoulder normal-appearing nontender. Normal external rotation. Internal rotation limited to lumbar spine. Abduction limited to about 120 degrees active and passive. Strength is  intact. Positive Hawkins and Neer's test.  Positive empty can test. Negative Yergason's and speeds test.  Right shoulder normal-appearing nontender normal motion normal strength negative impingement testing.  Pulses cap refill and sensation are intact distal bilateral upper extremities.    Lab and Radiology Results  Procedure: Real-time Ultrasound Guided Injection of left subacromial bursa Device: GE Logiq E   Images permanently stored and available for review in the ultrasound unit. Verbal informed consent obtained.  Discussed risks and benefits of procedure. Warned about infection bleeding damage to structures skin hypopigmentation and fat atrophy among others. Patient expresses understanding and agreement Time-out conducted.   Noted no overlying erythema, induration, or other signs of local infection.   Skin prepped in a sterile fashion.   Local anesthesia: Topical Ethyl chloride.   With sterile technique and under real time ultrasound guidance:  40 mg of Kenalog and 2 mL of Marcaine injected easily.   Completed without difficulty   Pain immediately resolved suggesting accurate placement of the medication.   Advised to call if fevers/chills, erythema, induration, drainage, or persistent bleeding.   Images permanently stored and available for review in the ultrasound unit.  Impression: Technically successful ultrasound guided injection.       EXAM: LEFT SHOULDER - 2+ VIEW  COMPARISON:  None.  FINDINGS: There is no evidence of fracture or dislocation. There is no evidence of arthropathy or other focal bone abnormality. Soft tissues are unremarkable.  IMPRESSION: Negative.   Electronically Signed   By: Caryn Bee  Dover M.D.   On: 09/09/2017 18:48 I personally (independently) visualized and performed the interpretation of the images attached in this note.     Assessment and Plan: 38 y.o. female with left shoulder pain.  Very likely rotator cuff  tendinitis/impingement or bursitis again.  Patient had great resolution of pain with limited physical therapy, home exercise program and subacromial bursa injection about a half a year ago.  Her pain gradually returned.  Plan to repeat the treatment at work previously with repeat subacromial bursa injection and physical therapy and home exercise program.  If symptoms again return or do not have a lasting benefit neck step would likely be further imaging including MRI for potential surgical planning.  Recheck in 6 weeks or so or sooner if needed.   PDMP not reviewed this encounter. Orders Placed This Encounter  Procedures  . Ambulatory referral to Physical Therapy    Referral Priority:   Routine    Referral Type:   Physical Medicine    Referral Reason:   Specialty Services Required    Requested Specialty:   Physical Therapy   No orders of the defined types were placed in this encounter.   Historical information moved to improve visibility of documentation.  Past Medical History:  Diagnosis Date  . Depression 09/12/2017   Past Surgical History:  Procedure Laterality Date  . ABDOMINAL SURGERY     lipo and tummy tuck  . APPENDECTOMY    . BREAST SURGERY     implants and lumpectomy  . CESAREAN SECTION    . TONSILLECTOMY    . TUBAL LIGATION     Social History   Tobacco Use  . Smoking status: Unknown If Ever Smoked  . Smokeless tobacco: Never Used  Substance Use Topics  . Alcohol use: Yes   family history includes Cancer in her father and mother; Stroke in her mother.  Medications: Current Outpatient Medications  Medication Sig Dispense Refill  . sertraline (ZOLOFT) 25 MG tablet Take 25 mg by mouth daily.     No current facility-administered medications for this visit.    No Known Allergies    Discussed warning signs or symptoms. Please see discharge instructions. Patient expresses understanding.

## 2019-03-23 DIAGNOSIS — N898 Other specified noninflammatory disorders of vagina: Secondary | ICD-10-CM | POA: Diagnosis not present

## 2019-03-23 DIAGNOSIS — N390 Urinary tract infection, site not specified: Secondary | ICD-10-CM | POA: Diagnosis not present

## 2019-03-23 DIAGNOSIS — R309 Painful micturition, unspecified: Secondary | ICD-10-CM | POA: Diagnosis not present

## 2019-05-14 ENCOUNTER — Ambulatory Visit (INDEPENDENT_AMBULATORY_CARE_PROVIDER_SITE_OTHER): Payer: BLUE CROSS/BLUE SHIELD

## 2019-05-14 ENCOUNTER — Ambulatory Visit (INDEPENDENT_AMBULATORY_CARE_PROVIDER_SITE_OTHER): Payer: BLUE CROSS/BLUE SHIELD | Admitting: Sports Medicine

## 2019-05-14 ENCOUNTER — Other Ambulatory Visit: Payer: Self-pay

## 2019-05-14 DIAGNOSIS — M1712 Unilateral primary osteoarthritis, left knee: Secondary | ICD-10-CM | POA: Diagnosis not present

## 2019-05-14 DIAGNOSIS — M25561 Pain in right knee: Secondary | ICD-10-CM

## 2019-05-14 DIAGNOSIS — G8929 Other chronic pain: Secondary | ICD-10-CM

## 2019-05-14 DIAGNOSIS — M1711 Unilateral primary osteoarthritis, right knee: Secondary | ICD-10-CM | POA: Insufficient documentation

## 2019-05-14 MED ORDER — MELOXICAM 15 MG PO TABS: ORAL_TABLET | ORAL | 3 refills | Status: DC

## 2019-05-14 NOTE — Progress Notes (Signed)
    Procedures performed today:    None.  Independent interpretation of notes and tests performed by another provider:   None.  Brief History, Exam, Impression, and Recommendations:    Chronic pain of right knee Diana Burton has been having right knee pain for some time now, posterior joint line. Mild gelling. Exam is benign. I did an unofficial ultrasound, no Baker's cyst visible. 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 Return to see me in 4 weeks, MRI if no better.    ___________________________________________ Ihor Austin. Benjamin Stain, M.D., ABFM., CAQSM. Primary Care and Sports Medicine [place] [facility]  Adjunct Instructor of Family Medicine  [employer]

## 2019-05-14 NOTE — Assessment & Plan Note (Signed)
Dima has been having right knee pain for some time now, posterior joint line. Mild gelling. Exam is benign. I did an unofficial ultrasound, no Baker's cyst visible. 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 Return to see me in 4 weeks, MRI if no better.

## 2019-06-11 ENCOUNTER — Other Ambulatory Visit: Payer: Self-pay

## 2019-06-11 ENCOUNTER — Encounter: Payer: Self-pay | Admitting: Sports Medicine

## 2019-06-11 ENCOUNTER — Ambulatory Visit (INDEPENDENT_AMBULATORY_CARE_PROVIDER_SITE_OTHER): Payer: BLUE CROSS/BLUE SHIELD | Admitting: Sports Medicine

## 2019-06-11 DIAGNOSIS — M1711 Unilateral primary osteoarthritis, right knee: Secondary | ICD-10-CM

## 2019-06-11 NOTE — Progress Notes (Signed)
    Procedures performed today:    Procedure: Real-time Ultrasound Guided injection of the right knee Device: Samsung HS60  Verbal informed consent obtained.  Time-out conducted.  Noted no overlying erythema, induration, or other signs of local infection.  Skin prepped in a sterile fashion.  Local anesthesia: Topical Ethyl chloride.  With sterile technique and under real time ultrasound guidance: 1 cc Kenalog 40, 2 cc lidocaine, 2 cc bupivacaine injected easily Completed without difficulty  Pain immediately resolved suggesting accurate placement of the medication.  Advised to call if fevers/chills, erythema, induration, drainage, or persistent bleeding.  Images permanently stored and available for review in the ultrasound unit.  Impression: Technically successful ultrasound guided injection.  Independent interpretation of notes and tests performed by another provider:   None.  Brief History, Exam, Impression, and Recommendations:    Primary osteoarthritis of right knee This is a pleasant 39 year old female, she has had chronic knee pain, x-rays did show mild medial joint space narrowing consistent with mild early arthritis. Unfortunately she failed conservative measures in the form of activity modification, meloxicam, rehab exercises. I injected her knee joint today, return to see me in a month, we did discuss viscosupplementation as a follow-up step.    ___________________________________________ Ihor Austin. Benjamin Stain, M.D., ABFM., CAQSM. Primary Care and Sports Medicine [place] [facility]  Adjunct Instructor of Family Medicine  [employer]

## 2019-06-11 NOTE — Assessment & Plan Note (Signed)
This is a pleasant 39 year old female, she has had chronic knee pain, x-rays did show mild medial joint space narrowing consistent with mild early arthritis. Unfortunately she failed conservative measures in the form of activity modification, meloxicam, rehab exercises. I injected her knee joint today, return to see me in a month, we did discuss viscosupplementation as a follow-up step.

## 2019-07-02 ENCOUNTER — Ambulatory Visit: Payer: BLUE CROSS/BLUE SHIELD | Admitting: Sports Medicine

## 2019-08-13 ENCOUNTER — Emergency Department (INDEPENDENT_AMBULATORY_CARE_PROVIDER_SITE_OTHER)
Admission: RE | Admit: 2019-08-13 | Discharge: 2019-08-13 | Disposition: A | Discharge status: Home / Self Care | Payer: BLUE CROSS/BLUE SHIELD | Source: Ambulatory Visit

## 2019-08-13 ENCOUNTER — Other Ambulatory Visit: Payer: Self-pay | Admitting: Emergency Medicine

## 2019-08-13 ENCOUNTER — Other Ambulatory Visit: Payer: Self-pay

## 2019-08-13 VITALS — BP 144/81 | HR 68 | Temp 98.6°F | Ht 71.0 in | Wt 185.0 lb

## 2019-08-13 DIAGNOSIS — M545 Low back pain, unspecified: Secondary | ICD-10-CM

## 2019-08-13 DIAGNOSIS — R8290 Unspecified abnormal findings in urine: Secondary | ICD-10-CM

## 2019-08-13 DIAGNOSIS — R300 Dysuria: Secondary | ICD-10-CM

## 2019-08-13 DIAGNOSIS — R110 Nausea: Secondary | ICD-10-CM | POA: Diagnosis not present

## 2019-08-13 LAB — POCT CBC W AUTO DIFF (K'VILLE URGENT CARE)

## 2019-08-13 LAB — POCT URINALYSIS DIP (MANUAL ENTRY)
Bilirubin, UA: NEGATIVE
Blood, UA: NEGATIVE
Glucose, UA: NEGATIVE mg/dL
Ketones, POC UA: NEGATIVE mg/dL
Leukocytes, UA: NEGATIVE
Nitrite, UA: NEGATIVE
Protein Ur, POC: NEGATIVE mg/dL
Spec Grav, UA: 1.015 (ref 1.010–1.025)
Urobilinogen, UA: 0.2 E.U./dL
pH, UA: 5 (ref 5.0–8.0)

## 2019-08-13 LAB — POCT URINE PREGNANCY: Preg Test, Ur: NEGATIVE

## 2019-08-13 NOTE — Discharge Instructions (Signed)
  You may take 500mg  acetaminophen every 4-6 hours or in combination with ibuprofen 400-600mg  every 6-8 hours as needed for pain, inflammation, and fever.  Be sure to well hydrated with clear liquids and get at least 8 hours of sleep at night, preferably more while sick.  Drink enough water so your urine is pale yellow to clear in color.   Please follow up with family medicine or your gynecologist next week if not improving.

## 2019-08-13 NOTE — ED Provider Notes (Signed)
[facility]    CSN: [id number] Arrival date & time: 08/13/19  1743      History   Chief Complaint Chief Complaint  Patient presents with  . Dysuria    HPI Diana Burton is a 39 y.o. female.   HPI Diana Burton is a 39 y.o. female presenting to UC with c/o 1 week of cloudy malodorous urine, pain with urination, and nausea. She developed bilateral low back pain yesterday.  Denies fever, chills, vomiting or diarrhea. Denies abdominal pain. No hx of kidney stones. Denies vaginal symptoms but does not mind being tested for STIs.    Past Medical History:  Diagnosis Date  . Depression 09/12/2017    Patient Active Problem List   Diagnosis Date Noted  . Primary osteoarthritis of right knee 05/14/2019  . Depression 09/12/2017    Past Surgical History:  Procedure Laterality Date  . ABDOMINAL SURGERY     lipo and tummy tuck  . APPENDECTOMY    . BREAST SURGERY     implants and lumpectomy  . CESAREAN SECTION    . TONSILLECTOMY    . TUBAL LIGATION      OB History   No obstetric history on file.      Home Medications    Prior to Admission medications   Medication Sig Start Date End Date Taking? Authorizing Provider  phentermine 15 MG capsule Take 15 mg by mouth every morning.   Yes [provider]  meloxicam (MOBIC) 15 MG tablet One tab PO qAM with a meal for 2 weeks, then daily prn pain. 05/14/19   Silverio Decamp, MD  sertraline (ZOLOFT) 25 MG tablet Take 25 mg by mouth daily.    [provider]    Family History Family History  Problem Relation Age of Onset  . Stroke Mother   . Cancer Mother   . Cancer Father     Social History Social History   Tobacco Use  . Smoking status: Never Smoker  . Smokeless tobacco: Never Used  Vaping Use  . Vaping Use: Never used  Substance Use Topics  . Alcohol use: Yes  . Drug use: Not Currently     Allergies   Patient has no known allergies.   Review of Systems Review of  Systems  Constitutional: Negative for chills and fever.  Gastrointestinal: Positive for nausea. Negative for abdominal pain, diarrhea and vomiting.  Genitourinary: Positive for dysuria and frequency. Negative for decreased urine volume, flank pain, hematuria, pelvic pain, urgency, vaginal bleeding, vaginal discharge and vaginal pain.  Musculoskeletal: Positive for back pain. Negative for myalgias.     Physical Exam Triage Vital Signs ED Triage Vitals  Enc Vitals Group     BP 08/13/19 1754 (!) 144/81     Pulse Rate 08/13/19 1754 68     Resp --      Temp 08/13/19 1754 98.6 F (37 C)     Temp Source 08/13/19 1754 Oral     SpO2 08/13/19 1754 100 %     Weight 08/13/19 1755 185 lb (83.9 kg)     Height 08/13/19 1755 5\' 11"  (1.803 m)     Head Circumference --      Peak Flow --      Pain Score 08/13/19 1755 8     Pain Loc --      Pain Edu? --      Excl. in [place]? --    No data found.  Updated Vital Signs BP (!) 144/81 (BP  Location: Right Arm)   Pulse 68   Temp 98.6 F (37 C) (Oral)   Ht 5\' 11"  (1.803 m)   Wt 185 lb (83.9 kg)   LMP 07/23/2019 (Approximate)   SpO2 100%   BMI 25.80 kg/m   Visual Acuity Right Eye Distance:   Left Eye Distance:   Bilateral Distance:    Right Eye Near:   Left Eye Near:    Bilateral Near:     Physical Exam Vitals and nursing note reviewed.  Constitutional:      General: She is not in acute distress.    Appearance: Normal appearance. She is well-developed. She is not ill-appearing, toxic-appearing or diaphoretic.  HENT:     Head: Normocephalic and atraumatic.  Cardiovascular:     Rate and Rhythm: Normal rate and regular rhythm.  Pulmonary:     Effort: Pulmonary effort is normal. No respiratory distress.     Breath sounds: Normal breath sounds.  Abdominal:     General: There is no distension.     Palpations: Abdomen is soft.     Tenderness: There is no abdominal tenderness. There is no right CVA tenderness or left CVA tenderness.    Genitourinary:    Comments: Deferred. Pt gave self-swab. Musculoskeletal:        General: Normal range of motion.     Cervical back: Normal range of motion.  Skin:    General: Skin is warm and dry.  Neurological:     Mental Status: She is alert and oriented to person, place, and time.  Psychiatric:        Behavior: Behavior normal.      UC Treatments / Results  Labs (all labs ordered are listed, but only abnormal results are displayed) Labs Reviewed  URINE CULTURE  CBC WITH DIFFERENTIAL/PLATELET  BASIC METABOLIC PANEL  POCT URINALYSIS DIP (MANUAL ENTRY)  POCT URINE PREGNANCY  POCT CBC W AUTO DIFF ([facility])  CERVICOVAGINAL ANCILLARY ONLY    EKG   Radiology No results found.  Procedures Procedures (including critical care time)  Medications Ordered in UC Medications - No data to display  Initial Impression / Assessment and Plan / UC Course  I have reviewed the triage vital signs and the nursing notes.  Pertinent labs & imaging results that were available during my care of the patient were reviewed by me and considered in my medical decision making (see chart for details).     UA: normal Culture sent per pt request Pt states she was drinking more water today.  Vaginal swab sent- GC/chlamydia, BV and yeast tests pending. BMP- pending, pt wants to make sure her kidneys are okay  Encouraged good hydration and close f/u with PCP and GYN AVS given  Final Clinical Impressions(s) / UC Diagnoses   Final diagnoses:  Nausea without vomiting  Dysuria  Malodorous urine  Acute bilateral low back pain without sciatica     Discharge Instructions      You may take 500mg  acetaminophen every 4-6 hours or in combination with ibuprofen 400-600mg  every 6-8 hours as needed for pain, inflammation, and fever.  Be sure to well hydrated with clear liquids and get at least 8 hours of sleep at night, preferably more while sick.  Drink enough water so your urine  is pale yellow to clear in color.   Please follow up with family medicine or your gynecologist next week if not improving.     ED Prescriptions    None  PDMP not reviewed this encounter.   Lurene Shadow, [place] 08/13/19 2016

## 2019-08-13 NOTE — ED Triage Notes (Signed)
Low back pain, bad odor, cloudy urine, painful urination x 1 week, nausea

## 2019-08-14 LAB — BASIC METABOLIC PANEL
BUN: 15 mg/dL (ref 7–25)
CO2: 24 mmol/L (ref 20–32)
Calcium: 9.2 mg/dL (ref 8.6–10.2)
Chloride: 102 mmol/L (ref 98–110)
Creat: 0.89 mg/dL (ref 0.50–1.10)
Glucose, Bld: 86 mg/dL (ref 65–99)
Potassium: 4.2 mmol/L (ref 3.5–5.3)
Sodium: 136 mmol/L (ref 135–146)

## 2019-08-15 LAB — URINE CULTURE
MICRO NUMBER:: 10633663
SPECIMEN QUALITY:: ADEQUATE

## 2019-08-17 LAB — TRICHOMONAS VAGINALIS, PROBE AMP: Trichomonas vaginalis RNA: NOT DETECTED

## 2019-08-17 LAB — C. TRACHOMATIS/N. GONORRHOEAE RNA
C. trachomatis RNA, TMA: NOT DETECTED
N. gonorrhoeae RNA, TMA: NOT DETECTED

## 2019-09-15 IMAGING — DX DG SHOULDER 2+V*L*
3 series · 3 of 3 positions shown · non-contrast
Comparison: None.

CLINICAL DATA: Left shoulder pain

EXAM:
LEFT SHOULDER - 2+ VIEW

[shoulder grashey]
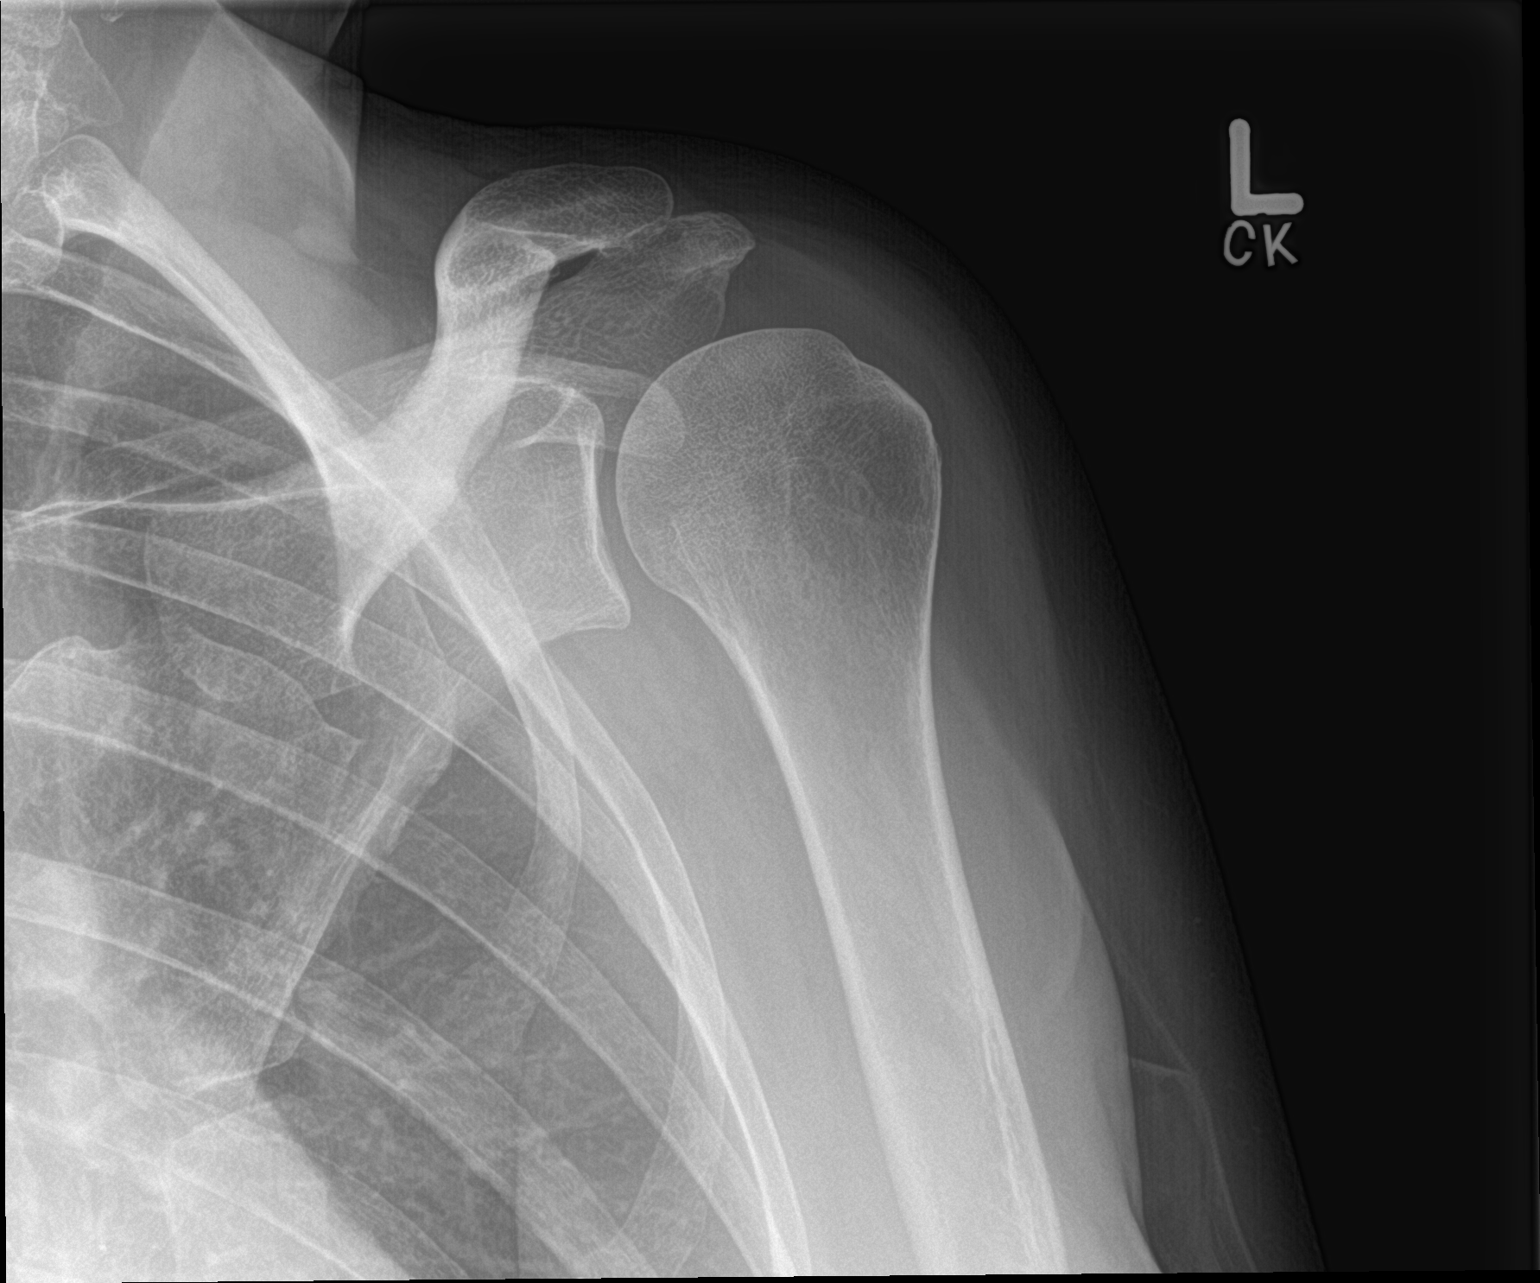

[shoulder y view]
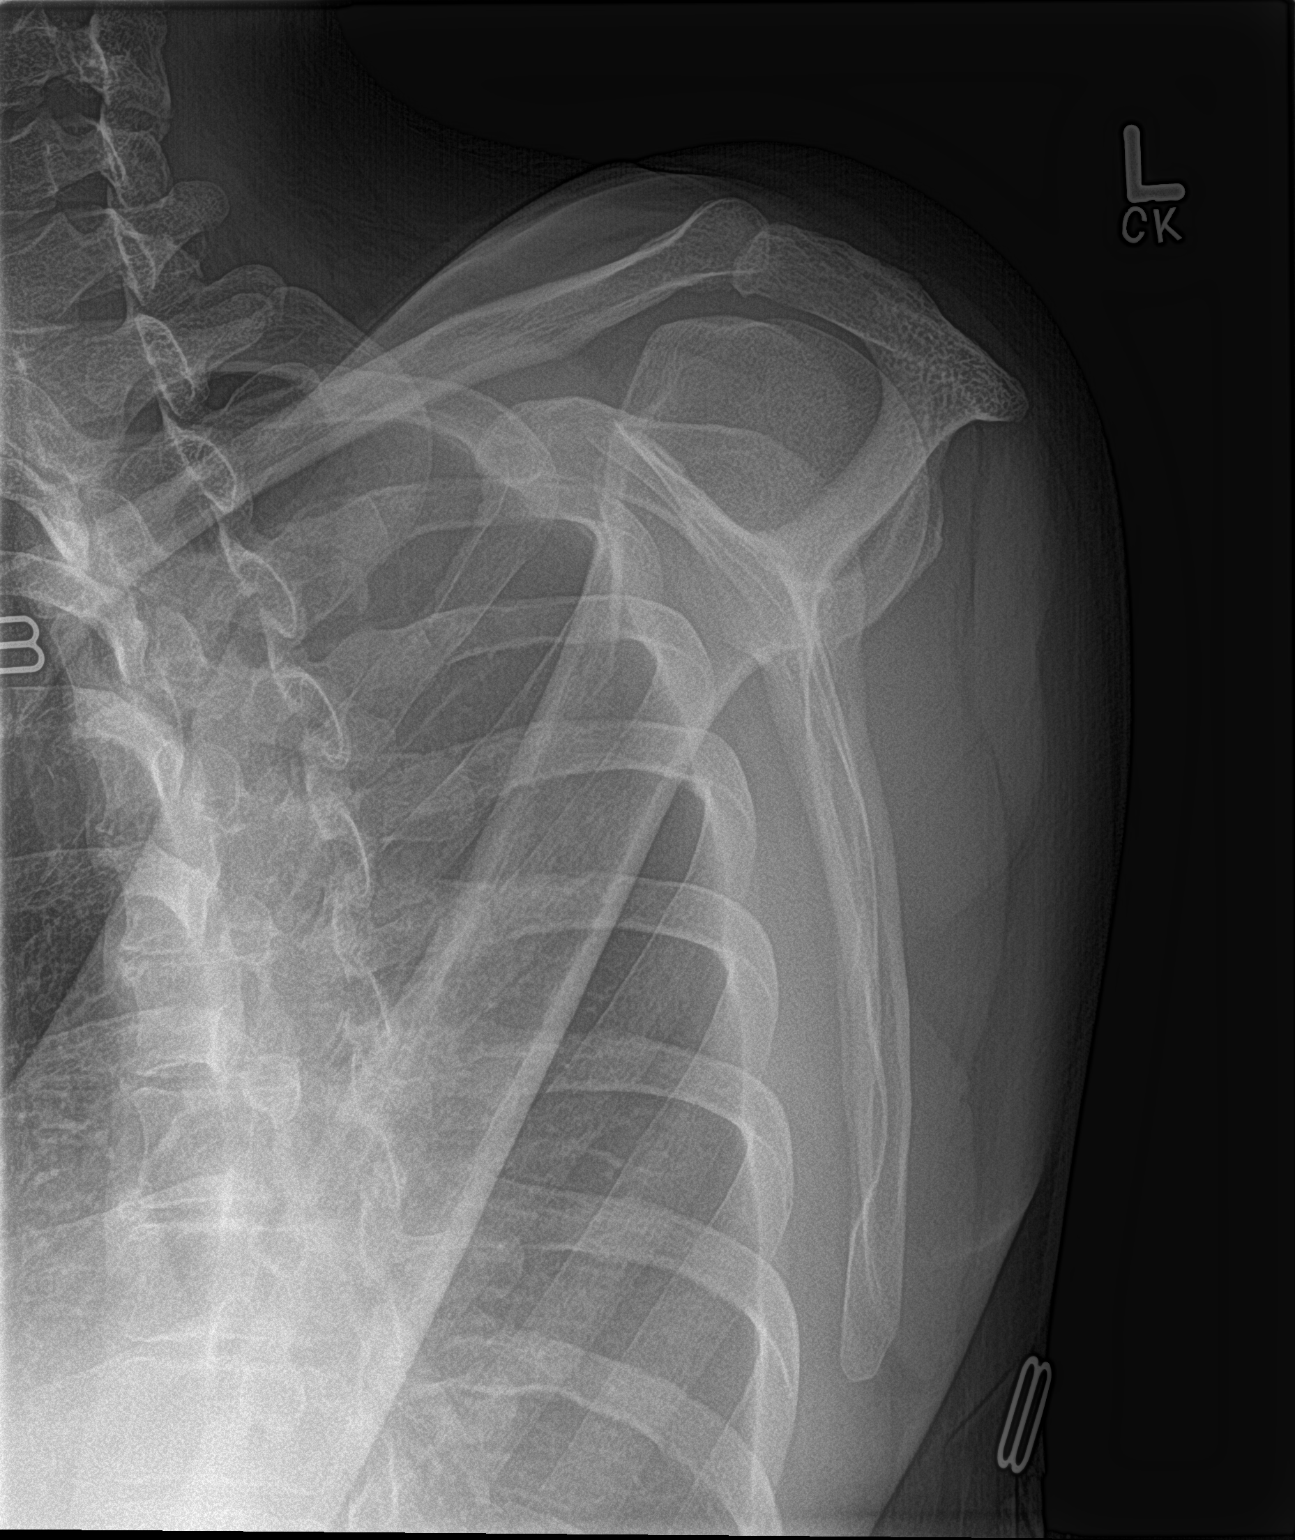

[shoulder axillary]
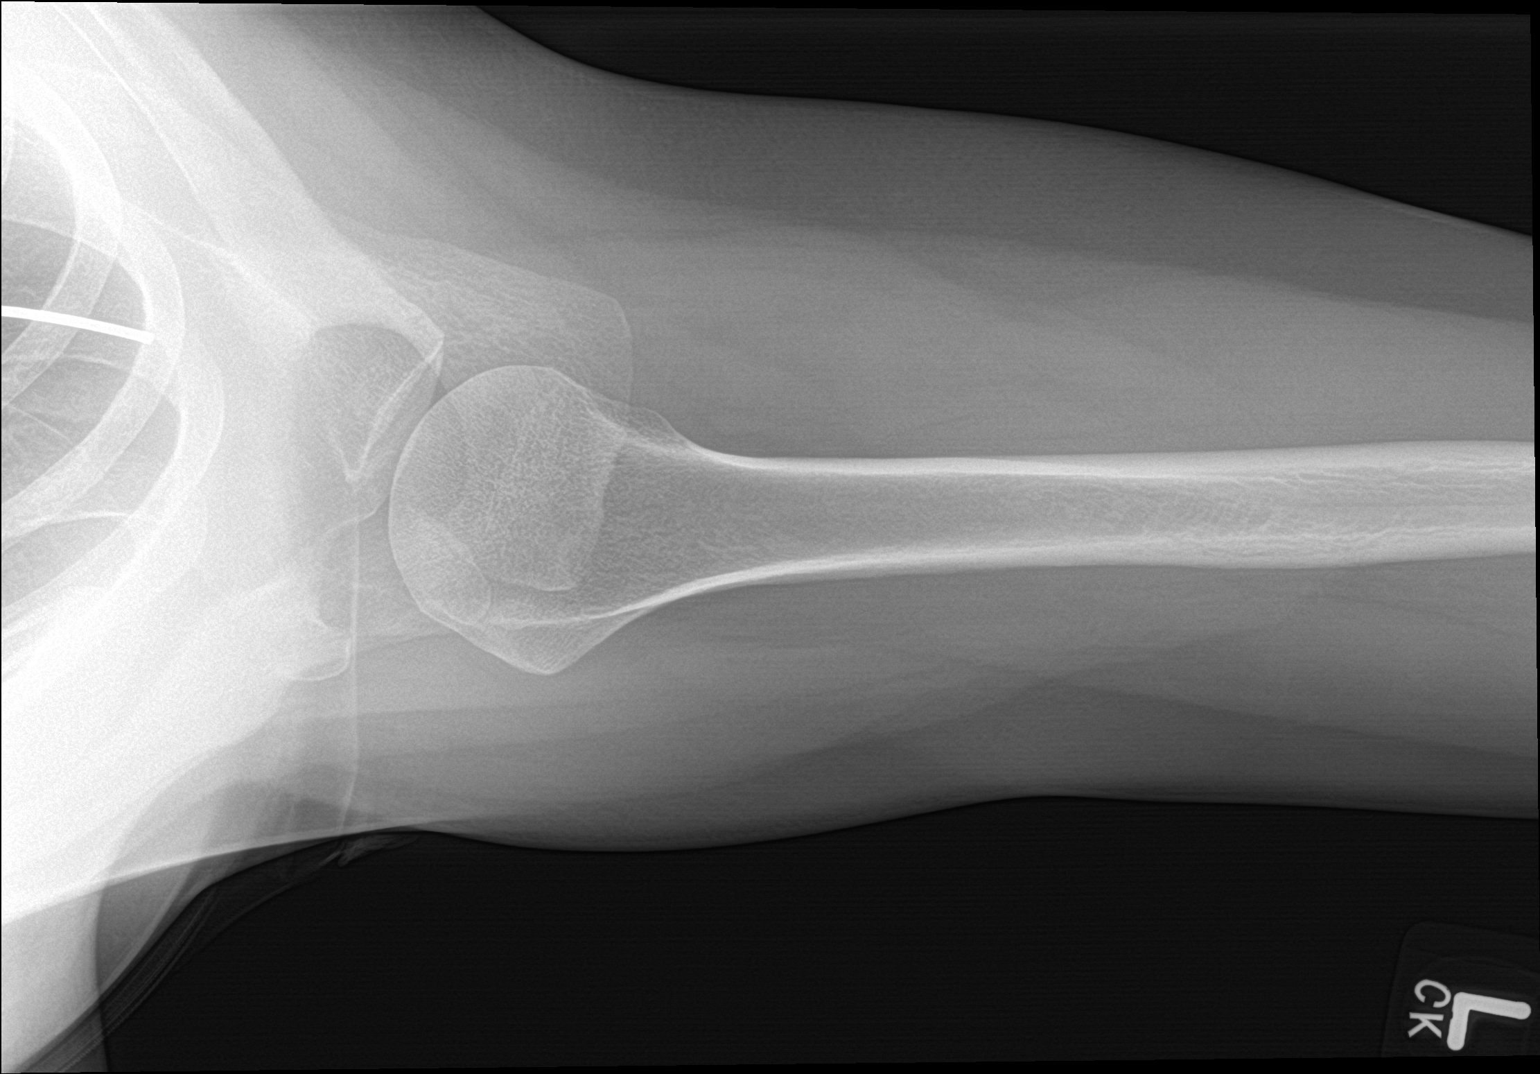

[3 of 3 positions shown; findings below may reference images not displayed]

FINDINGS: There is no evidence of fracture or dislocation. There is no
evidence of arthropathy or other focal bone abnormality. Soft
tissues are unremarkable.
IMPRESSION: Negative.

## 2019-09-16 DIAGNOSIS — N3090 Cystitis, unspecified without hematuria: Secondary | ICD-10-CM | POA: Diagnosis not present

## 2019-09-16 DIAGNOSIS — F3341 Major depressive disorder, recurrent, in partial remission: Secondary | ICD-10-CM | POA: Diagnosis not present

## 2019-09-16 DIAGNOSIS — R8290 Unspecified abnormal findings in urine: Secondary | ICD-10-CM | POA: Diagnosis not present

## 2019-09-16 DIAGNOSIS — Z13220 Encounter for screening for lipoid disorders: Secondary | ICD-10-CM | POA: Diagnosis not present

## 2019-09-16 DIAGNOSIS — Z8349 Family history of other endocrine, nutritional and metabolic diseases: Secondary | ICD-10-CM | POA: Diagnosis not present

## 2019-09-16 DIAGNOSIS — Z0000 Encounter for general adult medical examination without abnormal findings: Secondary | ICD-10-CM | POA: Diagnosis not present

## 2019-11-16 ENCOUNTER — Ambulatory Visit (INDEPENDENT_AMBULATORY_CARE_PROVIDER_SITE_OTHER): Payer: BLUE CROSS/BLUE SHIELD | Admitting: Sports Medicine

## 2019-11-16 ENCOUNTER — Other Ambulatory Visit: Payer: Self-pay

## 2019-11-16 ENCOUNTER — Ambulatory Visit (INDEPENDENT_AMBULATORY_CARE_PROVIDER_SITE_OTHER): Payer: BLUE CROSS/BLUE SHIELD

## 2019-11-16 DIAGNOSIS — M1711 Unilateral primary osteoarthritis, right knee: Secondary | ICD-10-CM | POA: Diagnosis not present

## 2019-11-16 NOTE — Assessment & Plan Note (Signed)
This is a pleasant 39 year old female, she has had right knee pain for some time now, medial joint line, posterior joint line. She did fail a steroid injection approximately 5 months ago, on exam she only has minimal swelling, negative McMurray sign. She does have persistent pain however so per her request we will do another injection today and proceed with MRI, she gets appropriate [facility]. Return to see me in 1 month. If she does have a large meniscal tear then we will need to do a referral for arthroscopy, if only osteoarthritis we can switch to approval for viscosupplementation.

## 2019-11-16 NOTE — Progress Notes (Signed)
    Procedures performed today:    Procedure: Real-time Ultrasound Guided injection of the right knee Device: Samsung HS60  Verbal informed consent obtained.  Time-out conducted.  Noted no overlying erythema, induration, or other signs of local infection.  Skin prepped in a sterile fashion.  Local anesthesia: Topical Ethyl chloride.  With sterile technique and under real time ultrasound guidance: 1 cc Kenalog 40, 2 cc lidocaine, 2 cc bupivacaine injected easily Completed without difficulty  Pain immediately resolved suggesting accurate placement of the medication.  Advised to call if fevers/chills, erythema, induration, drainage, or persistent bleeding.  Images permanently stored and available for review in PACS.  Impression: Technically successful ultrasound guided injection.  Independent interpretation of notes and tests performed by another provider:   None.  Brief History, Exam, Impression, and Recommendations:    Primary osteoarthritis of right knee This is a pleasant 39 year old female, she has had right knee pain for some time now, medial joint line, posterior joint line. She did fail a steroid injection approximately 5 months ago, on exam she only has minimal swelling, negative McMurray sign. She does have persistent pain however so per her request we will do another injection today and proceed with MRI, she gets appropriate [facility]. Return to see me in 1 month. If she does have a large meniscal tear then we will need to do a referral for arthroscopy, if only osteoarthritis we can switch to approval for viscosupplementation.    ___________________________________________ Ihor Austin. Benjamin Stain, M.D., ABFM., CAQSM. Primary Care and Sports Medicine [place] [facility]  Adjunct Instructor of Family Medicine  [employer]

## 2019-12-03 DIAGNOSIS — M67961 Unspecified disorder of synovium and tendon, right lower leg: Secondary | ICD-10-CM | POA: Diagnosis not present

## 2019-12-03 DIAGNOSIS — M67863 Other specified disorders of tendon, right knee: Secondary | ICD-10-CM | POA: Diagnosis not present

## 2019-12-14 ENCOUNTER — Ambulatory Visit (INDEPENDENT_AMBULATORY_CARE_PROVIDER_SITE_OTHER): Payer: BLUE CROSS/BLUE SHIELD | Admitting: Sports Medicine

## 2019-12-14 ENCOUNTER — Other Ambulatory Visit: Payer: Self-pay

## 2019-12-14 DIAGNOSIS — M1711 Unilateral primary osteoarthritis, right knee: Secondary | ICD-10-CM | POA: Diagnosis not present

## 2019-12-14 NOTE — Progress Notes (Signed)
    Procedures performed today:    None.  Independent interpretation of notes and tests performed by another provider:   None.  Brief History, Exam, Impression, and Recommendations:    Primary osteoarthritis of right knee Diana Burton is a pleasant 39 year old female, she has had right knee pain for some time, at the last visit we did a steroid injection, previously done 6 months ago. She is doing well today, we did obtain an MRI that showed potential patellar tendinosis but nothing at the site of pain at the medial joint line. There is likely mild osteoarthritis, but too mild to see on MRI. At this point she can come back to see me on an as-needed basis, she understands that the next step would be viscosupplementation and that she would need to call me for approval prior to proceeding.    ___________________________________________ Ihor Austin. Benjamin Stain, M.D., ABFM., CAQSM. Primary Care and Sports Medicine  [facility]  Adjunct Instructor of Family Medicine  [employer] [facility] [employer] [facility]

## 2019-12-14 NOTE — Assessment & Plan Note (Signed)
Diana Burton is a pleasant 39 year old female, she has had right knee pain for some time, at the last visit we did a steroid injection, previously done 6 months ago. She is doing well today, we did obtain an MRI that showed potential patellar tendinosis but nothing at the site of pain at the medial joint line. There is likely mild osteoarthritis, but too mild to see on MRI. At this point she can come back to see me on an as-needed basis, she understands that the next step would be viscosupplementation and that she would need to call me for approval prior to proceeding.

## 2019-12-23 DIAGNOSIS — Z01419 Encounter for gynecological examination (general) (routine) without abnormal findings: Secondary | ICD-10-CM | POA: Diagnosis not present

## 2019-12-23 DIAGNOSIS — Z6826 Body mass index (BMI) 26.0-26.9, adult: Secondary | ICD-10-CM | POA: Diagnosis not present

## 2020-09-13 DIAGNOSIS — Z833 Family history of diabetes mellitus: Secondary | ICD-10-CM | POA: Diagnosis not present

## 2020-09-13 DIAGNOSIS — Z0000 Encounter for general adult medical examination without abnormal findings: Secondary | ICD-10-CM | POA: Diagnosis not present

## 2020-09-13 DIAGNOSIS — L659 Nonscarring hair loss, unspecified: Secondary | ICD-10-CM | POA: Diagnosis not present

## 2020-09-13 DIAGNOSIS — Z202 Contact with and (suspected) exposure to infections with a predominantly sexual mode of transmission: Secondary | ICD-10-CM | POA: Diagnosis not present

## 2020-09-29 DIAGNOSIS — J00 Acute nasopharyngitis [common cold]: Secondary | ICD-10-CM | POA: Diagnosis not present

## 2020-10-01 DIAGNOSIS — Z1231 Encounter for screening mammogram for malignant neoplasm of breast: Secondary | ICD-10-CM | POA: Diagnosis not present

## 2020-10-07 DIAGNOSIS — N6321 Unspecified lump in the left breast, upper outer quadrant: Secondary | ICD-10-CM | POA: Diagnosis not present

## 2020-10-14 DIAGNOSIS — N6332 Unspecified lump in axillary tail of the left breast: Secondary | ICD-10-CM | POA: Diagnosis not present

## 2020-10-14 DIAGNOSIS — D242 Benign neoplasm of left breast: Secondary | ICD-10-CM | POA: Diagnosis not present

## 2021-02-07 DIAGNOSIS — D261 Other benign neoplasm of corpus uteri: Secondary | ICD-10-CM | POA: Diagnosis not present

## 2021-02-07 DIAGNOSIS — N921 Excessive and frequent menstruation with irregular cycle: Secondary | ICD-10-CM | POA: Diagnosis not present

## 2021-02-07 DIAGNOSIS — R300 Dysuria: Secondary | ICD-10-CM | POA: Diagnosis not present

## 2021-02-07 DIAGNOSIS — Z01419 Encounter for gynecological examination (general) (routine) without abnormal findings: Secondary | ICD-10-CM | POA: Diagnosis not present

## 2021-02-07 DIAGNOSIS — Z6826 Body mass index (BMI) 26.0-26.9, adult: Secondary | ICD-10-CM | POA: Diagnosis not present

## 2021-05-19 IMAGING — DX DG KNEE COMPLETE 4+V*R*
4 series · 4 of 4 positions shown · non-contrast
Comparison: None.

CLINICAL DATA: Right knee pain, no known injury, initial encounter

EXAM:
RIGHT KNEE - COMPLETE 4+ VIEW

[knee sunrise]
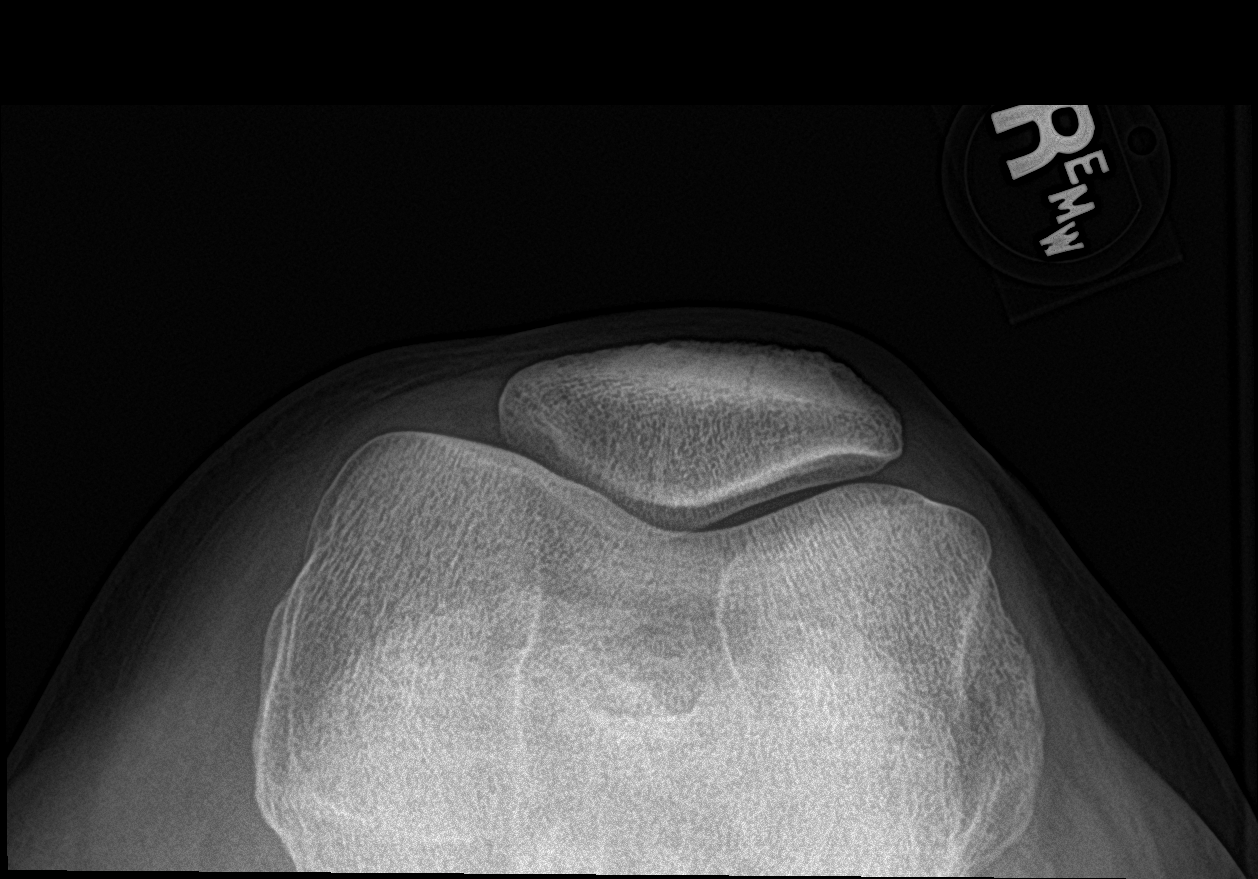

[tunnel]
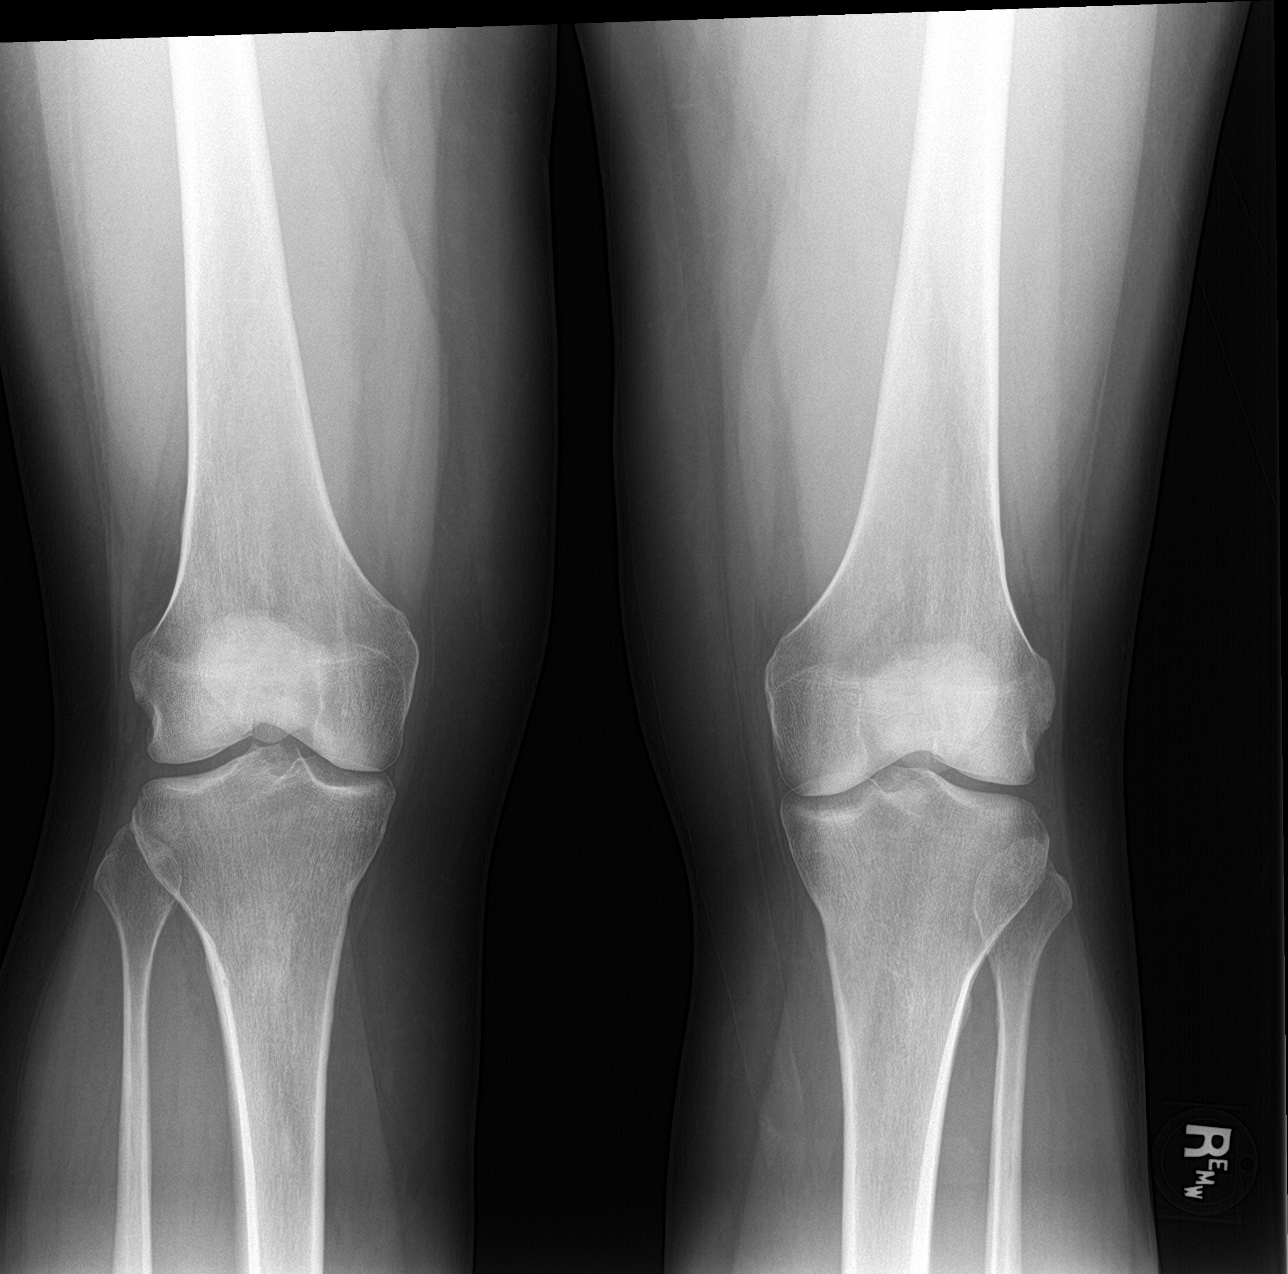

[knee lat]
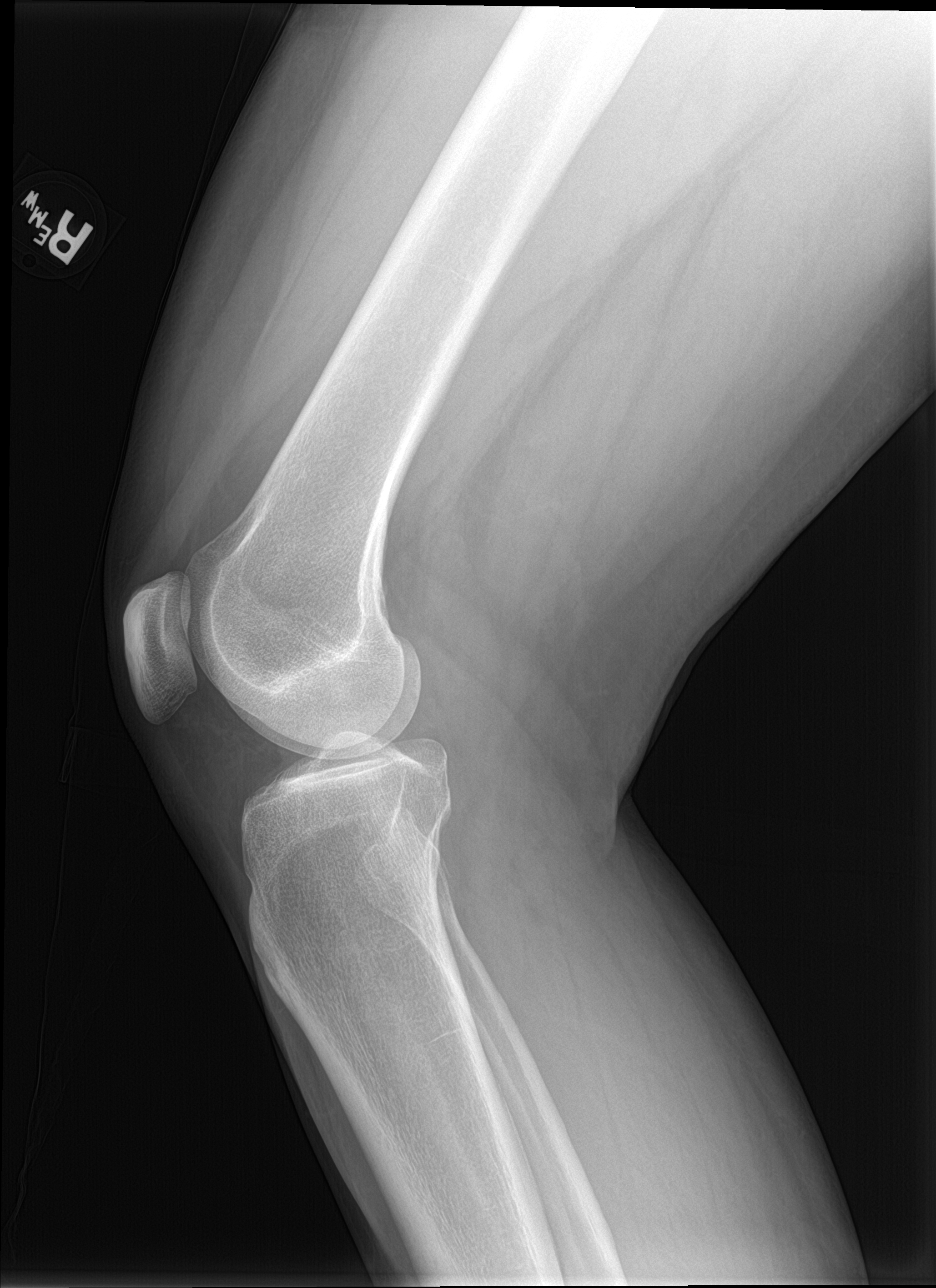

[knee ap]
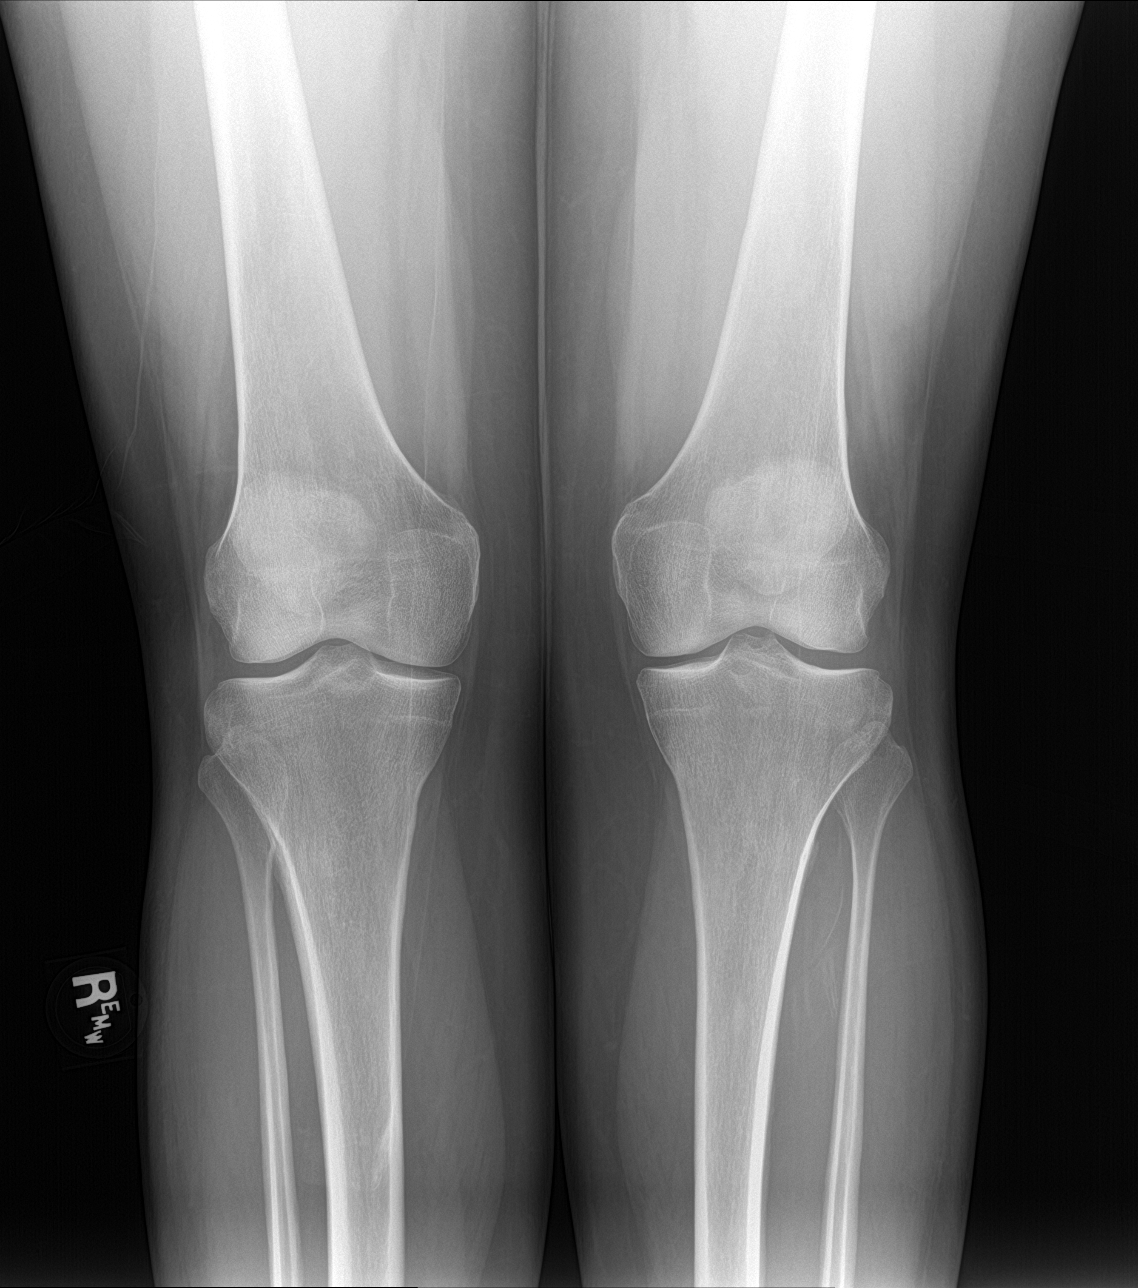

[4 of 4 positions shown; findings below may reference images not displayed]

FINDINGS: Mild medial joint space narrowing is noted. No significant
osteophytic changes are seen. No joint effusion is seen. No acute
fracture is noted.
IMPRESSION: Very mild medial joint space narrowing noted.

## 2021-08-19 DIAGNOSIS — Z9889 Other specified postprocedural states: Secondary | ICD-10-CM | POA: Diagnosis not present

## 2021-08-19 DIAGNOSIS — H04123 Dry eye syndrome of bilateral lacrimal glands: Secondary | ICD-10-CM | POA: Diagnosis not present

## 2021-08-19 DIAGNOSIS — H538 Other visual disturbances: Secondary | ICD-10-CM | POA: Diagnosis not present

## 2021-08-19 DIAGNOSIS — H53121 Transient visual loss, right eye: Secondary | ICD-10-CM | POA: Diagnosis not present

## 2021-08-19 DIAGNOSIS — Z832 Family history of diseases of the blood and blood-forming organs and certain disorders involving the immune mechanism: Secondary | ICD-10-CM | POA: Diagnosis not present

## 2021-08-19 DIAGNOSIS — H5461 Unqualified visual loss, right eye, normal vision left eye: Secondary | ICD-10-CM | POA: Diagnosis not present

## 2021-08-20 DIAGNOSIS — H547 Unspecified visual loss: Secondary | ICD-10-CM | POA: Diagnosis not present

## 2021-08-20 DIAGNOSIS — H5461 Unqualified visual loss, right eye, normal vision left eye: Secondary | ICD-10-CM | POA: Diagnosis not present

## 2021-10-20 ENCOUNTER — Ambulatory Visit (INDEPENDENT_AMBULATORY_CARE_PROVIDER_SITE_OTHER): Payer: BLUE CROSS/BLUE SHIELD

## 2021-10-20 ENCOUNTER — Ambulatory Visit (INDEPENDENT_AMBULATORY_CARE_PROVIDER_SITE_OTHER): Payer: BLUE CROSS/BLUE SHIELD | Admitting: Sports Medicine

## 2021-10-20 DIAGNOSIS — M47816 Spondylosis without myelopathy or radiculopathy, lumbar region: Secondary | ICD-10-CM | POA: Diagnosis not present

## 2021-10-20 DIAGNOSIS — Z1231 Encounter for screening mammogram for malignant neoplasm of breast: Secondary | ICD-10-CM | POA: Diagnosis not present

## 2021-10-20 DIAGNOSIS — M545 Low back pain, unspecified: Secondary | ICD-10-CM | POA: Diagnosis not present

## 2021-10-20 MED ORDER — PREDNISONE 50 MG PO TABS: ORAL_TABLET | ORAL | 0 refills | Status: DC

## 2021-10-20 NOTE — Progress Notes (Signed)
    Procedures performed today:    None.  Independent interpretation of notes and tests performed by another provider:   None.  Brief History, Exam, Impression, and Recommendations:    Lumbar spondylosis This is a pleasant 41 year old female, she has a several week history of right-sided low back pain with radiation to the back and thigh but not past the knee, no trauma. Her hip exam is completely normal. No pain at the SI joint, I think this is more lumbar spondylitic processes. On exam she also has significant hamstring and gluteus tightness, we discussed the anatomy and pathophysiology. She needs to work on hamstring lengthening, she also needs to work aggressively on core conditioning. Again for the core conditioning for herniated disks, x-rays, 5 days of prednisone, return to see me in 6 weeks, MRI if not better. She also agrees to have improved hamstring flexibility.    ____________________________________________ Ihor Austin. Benjamin Stain, M.D., ABFM., CAQSM., AME. Primary Care and Sports Medicine [place] [facility]  Adjunct Professor of Family Medicine  [employer]  Restaurant manager, fast food

## 2021-10-20 NOTE — Assessment & Plan Note (Signed)
This is a pleasant 41 year old female, she has a several week history of right-sided low back pain with radiation to the back and thigh but not past the knee, no trauma. Her hip exam is completely normal. No pain at the SI joint, I think this is more lumbar spondylitic processes. On exam she also has significant hamstring and gluteus tightness, we discussed the anatomy and pathophysiology. She needs to work on hamstring lengthening, she also needs to work aggressively on core conditioning. Again for the core conditioning for herniated disks, x-rays, 5 days of prednisone, return to see me in 6 weeks, MRI if not better. She also agrees to have improved hamstring flexibility.

## 2021-11-23 ENCOUNTER — Encounter: Payer: Self-pay | Admitting: Sports Medicine

## 2021-11-23 ENCOUNTER — Ambulatory Visit (INDEPENDENT_AMBULATORY_CARE_PROVIDER_SITE_OTHER): Payer: BLUE CROSS/BLUE SHIELD | Admitting: Sports Medicine

## 2021-11-23 DIAGNOSIS — M47816 Spondylosis without myelopathy or radiculopathy, lumbar region: Secondary | ICD-10-CM

## 2021-11-23 NOTE — Progress Notes (Signed)
    Procedures performed today:    None.  Independent interpretation of notes and tests performed by another provider:   None.  Brief History, Exam, Impression, and Recommendations:    Lumbar spondylosis Diana Burton returns, she is a very pleasant 41 year old female, chronic axial low back pain, right-sided and discogenic, nothing radicular. Exam is benign, no tenderness over the right SI joint. X-rays were done that showed very mild degenerative changes, at this point she has failed greater than 6 weeks of physician directed conservative treatment including analgesics and home physical therapy, proceeding with MRI for epidural planning. She will return to see me over MRI results.    ____________________________________________ Gwen Her. [name], M.D., ABFM., CAQSM., AME. Primary Care and Sports Medicine [place] [facility]  Adjunct Professor of [place] [employer] [employer] Medicine  Risk manager

## 2021-11-23 NOTE — Assessment & Plan Note (Signed)
Diana Burton returns, she is a very pleasant 41 year old female, chronic axial low back pain, right-sided and discogenic, nothing radicular. Exam is benign, no tenderness over the right SI joint. X-rays were done that showed very mild degenerative changes, at this point she has failed greater than 6 weeks of physician directed conservative treatment including analgesics and home physical therapy, proceeding with MRI for epidural planning. She will return to see me over MRI results.

## 2021-12-01 ENCOUNTER — Ambulatory Visit: Payer: BLUE CROSS/BLUE SHIELD | Admitting: Sports Medicine

## 2021-12-03 ENCOUNTER — Ambulatory Visit (INDEPENDENT_AMBULATORY_CARE_PROVIDER_SITE_OTHER): Payer: BLUE CROSS/BLUE SHIELD

## 2021-12-03 DIAGNOSIS — M5126 Other intervertebral disc displacement, lumbar region: Secondary | ICD-10-CM | POA: Diagnosis not present

## 2021-12-03 DIAGNOSIS — M5136 Other intervertebral disc degeneration, lumbar region: Secondary | ICD-10-CM | POA: Diagnosis not present

## 2021-12-03 DIAGNOSIS — M47816 Spondylosis without myelopathy or radiculopathy, lumbar region: Secondary | ICD-10-CM

## 2021-12-03 DIAGNOSIS — M545 Low back pain, unspecified: Secondary | ICD-10-CM

## 2021-12-05 ENCOUNTER — Encounter (INDEPENDENT_AMBULATORY_CARE_PROVIDER_SITE_OTHER): Payer: BLUE CROSS/BLUE SHIELD | Admitting: Sports Medicine

## 2021-12-05 DIAGNOSIS — M47816 Spondylosis without myelopathy or radiculopathy, lumbar region: Secondary | ICD-10-CM

## 2021-12-05 NOTE — Telephone Encounter (Signed)
I spent 5 total minutes of online digital evaluation and management services in this patient-initiated request for online care. 

## 2021-12-15 DIAGNOSIS — Z0000 Encounter for general adult medical examination without abnormal findings: Secondary | ICD-10-CM | POA: Diagnosis not present

## 2021-12-15 DIAGNOSIS — Z131 Encounter for screening for diabetes mellitus: Secondary | ICD-10-CM | POA: Diagnosis not present

## 2021-12-15 DIAGNOSIS — F5101 Primary insomnia: Secondary | ICD-10-CM | POA: Diagnosis not present

## 2021-12-15 DIAGNOSIS — F3341 Major depressive disorder, recurrent, in partial remission: Secondary | ICD-10-CM | POA: Diagnosis not present

## 2021-12-15 DIAGNOSIS — F411 Generalized anxiety disorder: Secondary | ICD-10-CM | POA: Diagnosis not present

## 2021-12-15 DIAGNOSIS — Z23 Encounter for immunization: Secondary | ICD-10-CM | POA: Diagnosis not present

## 2021-12-15 DIAGNOSIS — G43B0 Ophthalmoplegic migraine, not intractable: Secondary | ICD-10-CM | POA: Diagnosis not present

## 2021-12-18 ENCOUNTER — Ambulatory Visit
Admission: RE | Admit: 2021-12-18 | Discharge: 2021-12-18 | Disposition: A | Discharge status: Home / Self Care | Payer: BLUE CROSS/BLUE SHIELD | Source: Ambulatory Visit | Attending: Sports Medicine | Admitting: Sports Medicine

## 2021-12-18 DIAGNOSIS — M47817 Spondylosis without myelopathy or radiculopathy, lumbosacral region: Secondary | ICD-10-CM | POA: Diagnosis not present

## 2021-12-18 DIAGNOSIS — M47816 Spondylosis without myelopathy or radiculopathy, lumbar region: Secondary | ICD-10-CM

## 2021-12-18 MED ORDER — METHYLPREDNISOLONE ACETATE 40 MG/ML INJ SUSP (RADIOLOG
80.0000 mg | Freq: Once | INTRAMUSCULAR | Status: AC
  Administered 2021-12-18: 80 mg via EPIDURAL

## 2021-12-18 MED ORDER — IOPAMIDOL (ISOVUE-M 200) INJECTION 41%
1.0000 mL | Freq: Once | INTRAMUSCULAR | Status: AC
  Administered 2021-12-18: 1 mL via EPIDURAL

## 2021-12-18 NOTE — Discharge Instructions (Signed)

## 2022-09-19 ENCOUNTER — Encounter: Payer: Self-pay | Admitting: Sports Medicine

## 2023-04-14 ENCOUNTER — Other Ambulatory Visit: Payer: Self-pay

## 2023-04-14 ENCOUNTER — Ambulatory Visit
Admission: RE | Admit: 2023-04-14 | Discharge: 2023-04-14 | Disposition: A | Discharge status: Home / Self Care | Payer: Self-pay | Source: Ambulatory Visit

## 2023-04-14 VITALS — BP 116/85 | HR 98 | Temp 98.4°F | Resp 17

## 2023-04-14 DIAGNOSIS — J309 Allergic rhinitis, unspecified: Secondary | ICD-10-CM

## 2023-04-14 DIAGNOSIS — J069 Acute upper respiratory infection, unspecified: Secondary | ICD-10-CM

## 2023-04-14 DIAGNOSIS — R059 Cough, unspecified: Secondary | ICD-10-CM

## 2023-04-14 MED ORDER — BENZONATATE 200 MG PO CAPS: 200.0000 mg | ORAL_CAPSULE | Freq: Three times a day (TID) | ORAL | 0 refills | Status: AC | PRN

## 2023-04-14 MED ORDER — FEXOFENADINE HCL 180 MG PO TABS: 180.0000 mg | ORAL_TABLET | Freq: Every day | ORAL | 0 refills | Status: AC

## 2023-04-14 MED ORDER — HYDROCODONE BIT-HOMATROP MBR 5-1.5 MG/5ML PO SOLN: 5.0000 mL | Freq: Four times a day (QID) | ORAL | 0 refills | Status: AC | PRN

## 2023-04-14 MED ORDER — AZITHROMYCIN 250 MG PO TABS: 250.0000 mg | ORAL_TABLET | Freq: Every day | ORAL | 0 refills | Status: AC

## 2023-04-14 MED ORDER — PREDNISONE 20 MG PO TABS: ORAL_TABLET | ORAL | 0 refills | Status: AC

## 2023-04-14 NOTE — ED Provider Notes (Signed)
 [facility]    CSN: [id number] Arrival date & time: 04/14/23  0930      History   Chief Complaint Chief Complaint  Patient presents with   Cough    W/ chest congestion   Headache    HPI Diana Burton is a 43 y.o. female.   HPI 43 year old female presents with possible URI reports a deep cough and congestion for days.  PMH significant for obesity, lumbar spondylosis, and depression.  Past Medical History:  Diagnosis Date   Depression 09/12/2017    Patient Active Problem List   Diagnosis Date Noted   Lumbar spondylosis 10/20/2021   Primary osteoarthritis of right knee 05/14/2019   Depression 09/12/2017    Past Surgical History:  Procedure Laterality Date   ABDOMINAL SURGERY     lipo and tummy tuck   APPENDECTOMY     BREAST SURGERY     implants and lumpectomy   CESAREAN SECTION     TONSILLECTOMY     TUBAL LIGATION      OB History   No obstetric history on file.      Home Medications    Prior to Admission medications   Medication Sig Start Date End Date Taking? Authorizing Provider  azithromycin (ZITHROMAX) 250 MG tablet Take 1 tablet (250 mg total) by mouth daily. Take first 2 tablets together, then 1 every day until finished. 04/14/23  Yes Trevor Iha, FNP  benzonatate (TESSALON) 200 MG capsule Take 1 capsule (200 mg total) by mouth 3 (three) times daily as needed for up to 7 days. 04/14/23 04/21/23 Yes Trevor Iha, FNP  fexofenadine [facility] ALLERGY) 180 MG tablet Take 1 tablet (180 mg total) by mouth daily for 15 days. 04/14/23 04/29/23 Yes Trevor Iha, FNP  HYDROcodone bit-homatropine (HYCODAN) 5-1.5 MG/5ML syrup Take 5 mLs by mouth every 6 (six) hours as needed for cough. 04/14/23  Yes Trevor Iha, FNP  predniSONE (DELTASONE) 20 MG tablet Take 3 tabs PO daily x 5 days. 04/14/23  Yes Trevor Iha, FNP  phentermine 15 MG capsule Take 15 mg by mouth every morning.    [provider]  sertraline (ZOLOFT) 25 MG tablet Take 25 mg  by mouth daily.    [provider]  SUMAtriptan (IMITREX) 50 MG tablet Take by mouth.    [provider]  traZODone (DESYREL) 50 MG tablet Take 1-2 tablets by mouth at bedtime as needed. 08/27/18   [provider]    Family History Family History  Problem Relation Age of Onset   Stroke Mother    Cancer Mother    Cancer Father     Social History Social History   Tobacco Use   Smoking status: Never   Smokeless tobacco: Never  Vaping Use   Vaping status: Never Used  Substance Use Topics   Alcohol use: Yes   Drug use: Not Currently     Allergies   Patient has no known allergies.   Review of Systems Review of Systems   Physical Exam Triage Vital Signs ED Triage Vitals  Encounter Vitals Group     BP      Systolic BP Percentile      Diastolic BP Percentile      Pulse      Resp      Temp      Temp src      SpO2      Weight      Height      Head Circumference  Peak Flow      Pain Score      Pain Loc      Pain Education      Exclude from Growth Chart    No data found.  Updated Vital Signs BP 116/85 (BP Location: Right Arm)   Pulse 98   Temp 98.4 F (36.9 C) (Oral)   Resp 17   SpO2 97%      Physical Exam Vitals and nursing note reviewed.  Constitutional:      Appearance: Normal appearance. She is well-developed. She is obese. She is ill-appearing. She is not toxic-appearing or diaphoretic.  HENT:     Head: Normocephalic and atraumatic.     Right Ear: Tympanic membrane, ear canal and external ear normal.     Left Ear: Tympanic membrane, ear canal and external ear normal.     Nose:     Comments: Clear rhinorrhea noted    Mouth/Throat:     Mouth: Mucous membranes are moist.     Pharynx: Oropharynx is clear.     Comments: Significant amount of clear drainage of posterior oropharynx noted Eyes:     Extraocular Movements: Extraocular movements intact.     Conjunctiva/sclera: Conjunctivae normal.     Pupils: Pupils are  equal, round, and reactive to light.  Cardiovascular:     Rate and Rhythm: Normal rate and regular rhythm.     Pulses: Normal pulses.     Heart sounds: Normal heart sounds.  Pulmonary:     Effort: Pulmonary effort is normal.     Breath sounds: Normal breath sounds. No wheezing, rhonchi or rales.     Comments: Infrequent nonproductive cough on exam Musculoskeletal:        General: Normal range of motion.     Cervical back: Normal range of motion and neck supple.  Skin:    General: Skin is warm and dry.  Neurological:     General: No focal deficit present.     Mental Status: She is alert and oriented to person, place, and time. Mental status is at baseline.  Psychiatric:        Mood and Affect: Mood normal.        Behavior: Behavior normal.        Thought Content: Thought content normal.      UC Treatments / Results  Labs (all labs ordered are listed, but only abnormal results are displayed) Labs Reviewed - No data to display  EKG   Radiology No results found.  Procedures Procedures (including critical care time)  Medications Ordered in UC Medications - No data to display  Initial Impression / Assessment and Plan / UC Course  I have reviewed the triage vital signs and the nursing notes.  Pertinent labs & imaging results that were available during my care of the patient were reviewed by me and considered in my medical decision making (see chart for details).     MDM: 1.  Acute URI-Rx'd Zithromax (500 mg day 1, then 250 mg-day 2 through 5); 2.,  Cough, unspecified type-Rx'd prednisone 20 mg tablet: Take 3 tabs p.o. daily x 5 days, Rx'd Tessalon 200 mg capsules: Take 1 capsule 3 times daily, as needed for cough, Rx'd Hycodan 5-1.5 mg / 5 mL syrup: Take 5 mL every 6 hours for cough. 3.  Allergic rhinitis, unspecified seasonality, unspecified trigger-Rx'd Allegra 180 mg fexofenadine daily x 5 days. Advised patient to take medications as directed with food to completion.   Advised patient to take  prednisone and Allegra with Zithromax daily for the next 5 days.  Advised may use Allegra as needed for postnasal drainage/drip after 5 days.  Advised may use Tessalon capsules daily or as needed for cough.  Advised may use Hycodan cough syrup at night for cough prior to sleep due to sedative effects.  Encouraged increase daily water intake to 64 ounces per day while taking these medications.    Final Clinical Impressions(s) / UC Diagnoses   Final diagnoses:  Cough, unspecified type  Acute URI  Allergic rhinitis, unspecified seasonality, unspecified trigger     Discharge Instructions      Advised patient to take medications as directed with food to completion.  Advised patient to take prednisone and Allegra with Zithromax daily for the next 5 days.  Advised may use Allegra as needed for postnasal drainage/drip after 5 days.  Advised may use Tessalon capsules daily or as needed for cough.  Advised may use Hycodan cough syrup at night for cough prior to sleep due to sedative effects.  Encouraged increase daily water intake to 64 ounces per day while taking these medications.  Advised if symptoms worsen and/or unresolved please follow-up with PCP or here for further evaluation.     ED Prescriptions     Medication Sig Dispense Auth. Provider   azithromycin (ZITHROMAX) 250 MG tablet Take 1 tablet (250 mg total) by mouth daily. Take first 2 tablets together, then 1 every day until finished. 6 tablet Trevor Iha, FNP   benzonatate (TESSALON) 200 MG capsule Take 1 capsule (200 mg total) by mouth 3 (three) times daily as needed for up to 7 days. 40 capsule Trevor Iha, FNP   predniSONE (DELTASONE) 20 MG tablet Take 3 tabs PO daily x 5 days. 15 tablet Trevor Iha, FNP   fexofenadine [facility] ALLERGY) 180 MG tablet Take 1 tablet (180 mg total) by mouth daily for 15 days. 15 tablet Trevor Iha, FNP   HYDROcodone bit-homatropine (HYCODAN) 5-1.5 MG/5ML syrup Take 5 mLs  by mouth every 6 (six) hours as needed for cough. 120 mL Trevor Iha, FNP      I have reviewed the PDMP during this encounter.   Trevor Iha, FNP 04/14/23 1606

## 2023-04-14 NOTE — ED Triage Notes (Signed)
 Pt c/o cough, congestion, and intermittent HA since Wed. Denies fever. Taking sudafed prn. Hx of bronchitis.

## 2023-04-14 NOTE — Discharge Instructions (Addendum)
 Advised patient to take medications as directed with food to completion.  Advised patient to take prednisone and Allegra with Zithromax daily for the next 5 days.  Advised may use Allegra as needed for postnasal drainage/drip after 5 days.  Advised may use Tessalon capsules daily or as needed for cough.  Advised may use Hycodan cough syrup at night for cough prior to sleep due to sedative effects.  Encouraged increase daily water intake to 64 ounces per day while taking these medications.  Advised if symptoms worsen and/or unresolved please follow-up with PCP or here for further evaluation.

## 2023-10-22 ENCOUNTER — Encounter: Payer: Self-pay | Admitting: Sports Medicine
